# Patient Record
Sex: Male | Born: 1950 | Race: White | Hispanic: No | Marital: Married | Smoking: Former smoker
Health system: Southern US, Academic
[De-identification: ages and names within clinical notes are randomized; demographics above are authoritative.]

## PROBLEM LIST (undated history)

## (undated) DIAGNOSIS — M129 Arthropathy, unspecified: Secondary | ICD-10-CM

## (undated) DIAGNOSIS — Z85828 Personal history of other malignant neoplasm of skin: Secondary | ICD-10-CM

## (undated) DIAGNOSIS — J342 Deviated nasal septum: Secondary | ICD-10-CM

## (undated) DIAGNOSIS — I10 Essential (primary) hypertension: Secondary | ICD-10-CM

## (undated) DIAGNOSIS — H6123 Impacted cerumen, bilateral: Secondary | ICD-10-CM

## (undated) DIAGNOSIS — E119 Type 2 diabetes mellitus without complications: Secondary | ICD-10-CM

## (undated) DIAGNOSIS — J309 Allergic rhinitis, unspecified: Secondary | ICD-10-CM

## (undated) DIAGNOSIS — K219 Gastro-esophageal reflux disease without esophagitis: Secondary | ICD-10-CM

## (undated) HISTORY — DX: Essential (primary) hypertension: I10

## (undated) HISTORY — DX: Gastro-esophageal reflux disease without esophagitis: K21.9

## (undated) HISTORY — DX: Deviated nasal septum: J34.2

## (undated) HISTORY — DX: Allergic rhinitis, unspecified: J30.9

## (undated) HISTORY — DX: Personal history of other malignant neoplasm of skin: Z85.828

## (undated) HISTORY — DX: Impacted cerumen, bilateral: H61.23

## (undated) HISTORY — DX: Type 2 diabetes mellitus without complications: E11.9

---
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
## 1996-03-02 ENCOUNTER — Other Ambulatory Visit (HOSPITAL_COMMUNITY): Payer: Self-pay

## 2002-05-12 HISTORY — PX: SURGERY SCROTAL / TESTICULAR: SUR1316

## 2007-06-09 DIAGNOSIS — R9439 Abnormal result of other cardiovascular function study: Secondary | ICD-10-CM | POA: Insufficient documentation

## 2020-03-24 IMAGING — MR MRI LUMBAR SPINE WITHOUT CONTRAST
7 series · 48 of 48 positions shown · non-contrast
Comparison: None available.

﻿EXAM:  MRI LUMBAR SPINE WITHOUT CONTRAST
INDICATION: 69-year-old male with history of low back pain with radiculopathy.  No history of malignancy or back surgery.
TECHNIQUE: Axial, coronal and sagittal images including T1, T2 and STIR sequences.  Overall quality of the images is less than optimal due to body habitus.  Quality of the images is acceptable for interpretation.

[Series 11: T2 · sagittal · 5.0mm · 1.00mm/px · 5 of 15 slices shown (1 of 4)]
[im 1/15]
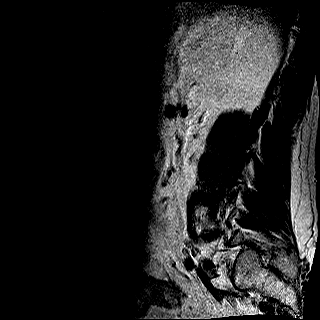
[im 4/15]
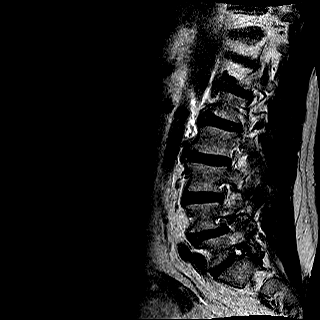
[im 8/15]
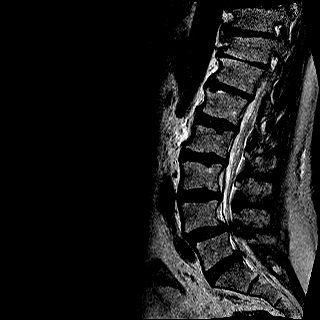
[im 11/15]
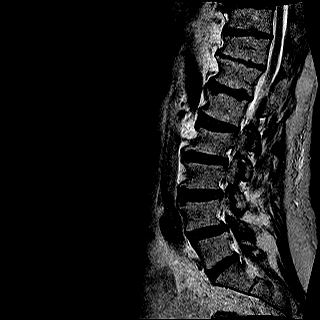
[im 15/15]
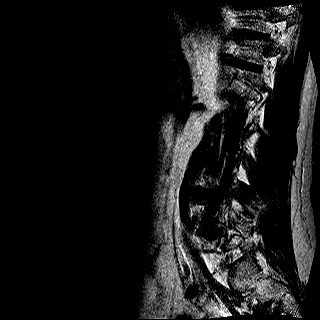

[Series 13: T1 · sagittal · 5.0mm · 1.00mm/px · 6 of 17 slices shown (1 of 2)]
[im 1/17]
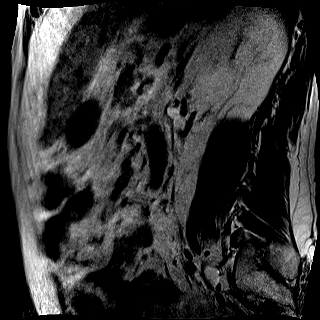
[im 4/17]
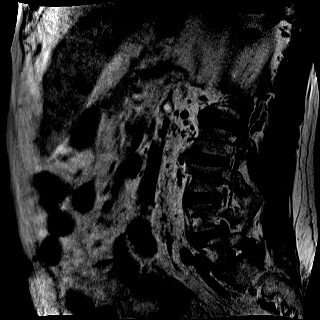
[im 7/17]
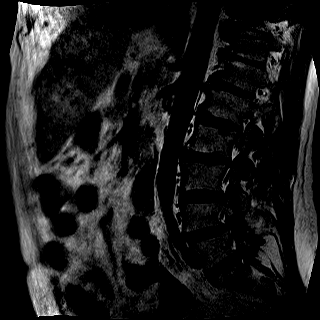
[im 10/17]
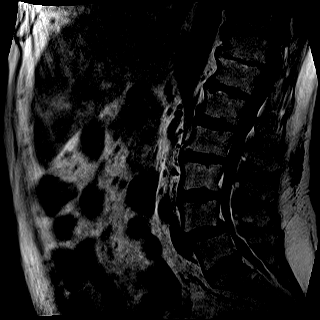
[im 13/17]
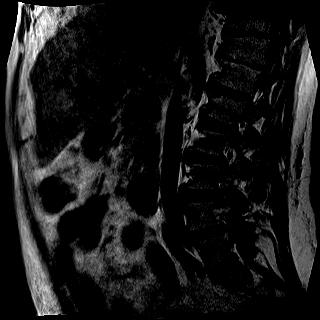
[im 17/17]
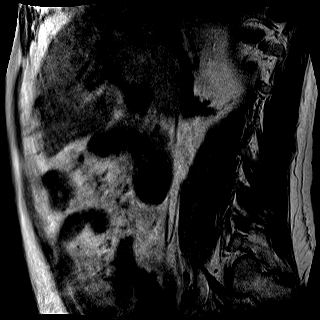

[Series 14: STIR · sagittal · 5.0mm · 1.25mm/px · 6 of 17 slices shown]
[im 1/17]
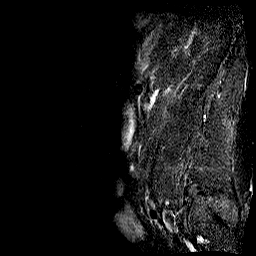
[im 4/17]
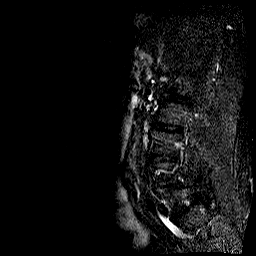
[im 7/17]
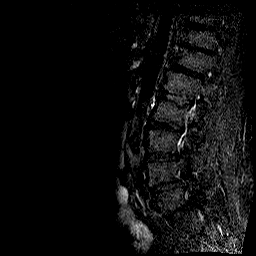
[im 10/17]
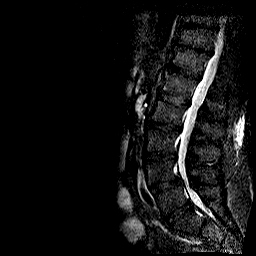
[im 13/17]
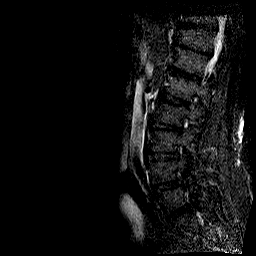
[im 17/17]
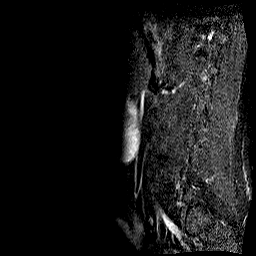

[Series 15: T2 · axial · 5.0mm · 0.89mm/px · z∈[-148,+71]mm · 9 of 25 slices shown (2 of 4)]
[im 1/25]
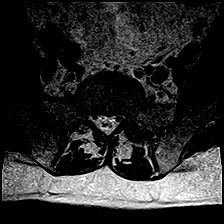
[im 4/25]
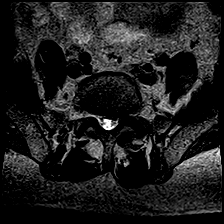
[im 7/25]
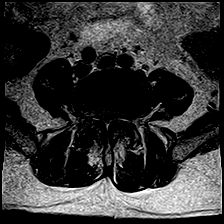
[im 10/25]
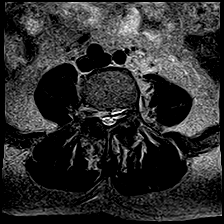
[im 13/25]
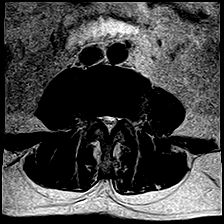
[im 16/25]
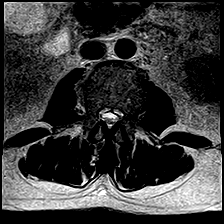
[im 19/25]
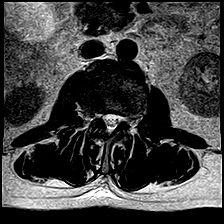
[im 22/25]
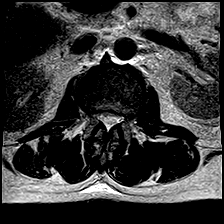
[im 25/25]
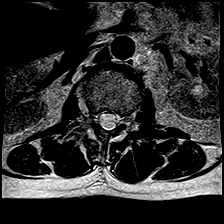

[Series 16: T1 · axial · 5.0mm · 0.89mm/px · z∈[-148,+71]mm · 9 of 25 slices shown (2 of 2)]
[im 1/25]
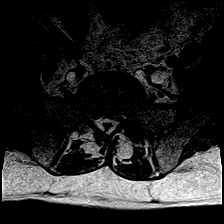
[im 4/25]
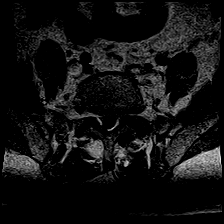
[im 7/25]
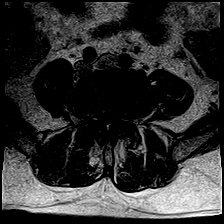
[im 10/25]
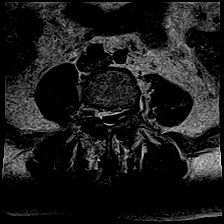
[im 13/25]
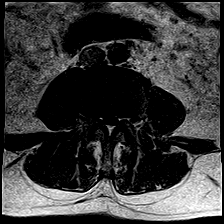
[im 16/25]
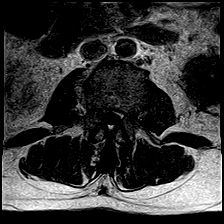
[im 19/25]
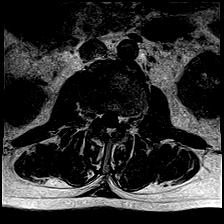
[im 22/25]
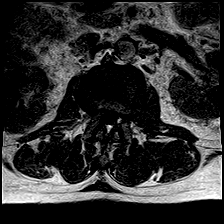
[im 25/25]
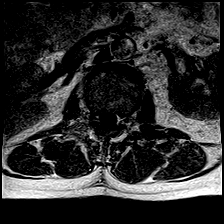

[Series 17: T2 · coronal · 4.0mm · 1.34mm/px · 7 of 20 slices shown (3 of 4)]
[im 1/20]
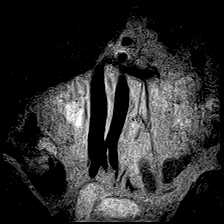
[im 4/20]
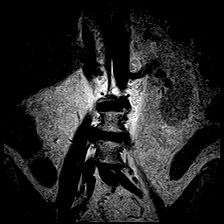
[im 7/20]
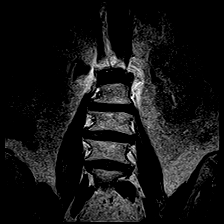
[im 10/20]
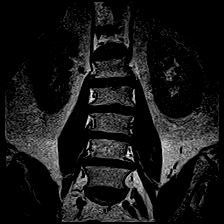
[im 13/20]
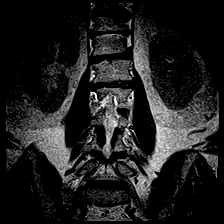
[im 16/20]
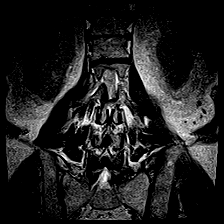
[im 20/20]
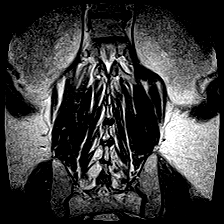

[Series 18: T2 · sagittal · 5.0mm · 1.00mm/px · 6 of 17 slices shown (4 of 4)]
[im 1/17]
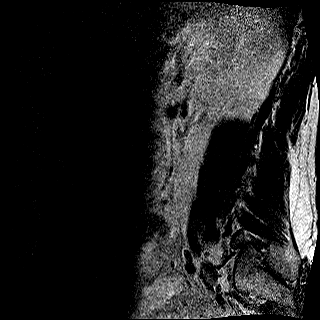
[im 4/17]
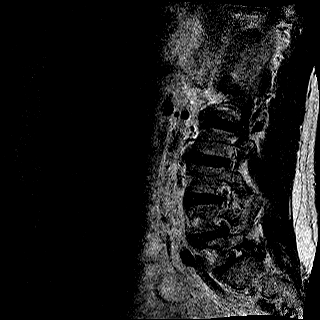
[im 7/17]
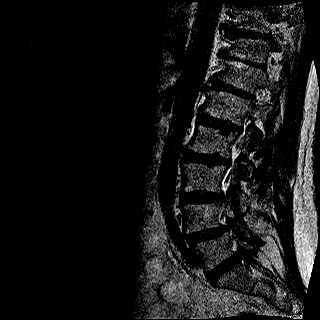
[im 10/17]
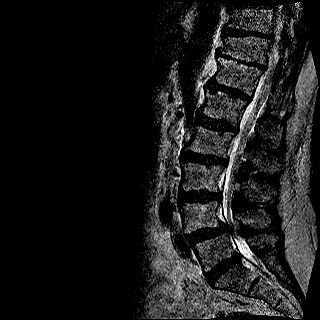
[im 13/17]
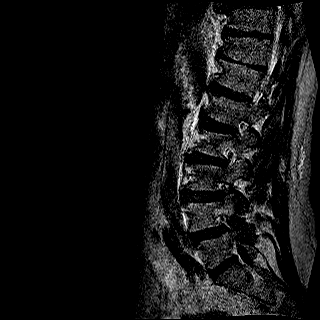
[im 17/17]
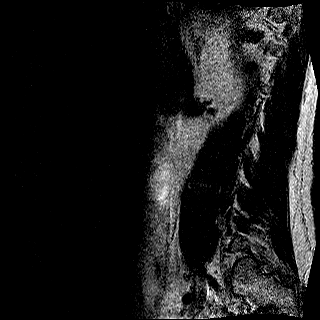

[48 of 48 positions shown; findings below may reference images not displayed]

FINDINGS: No acute bony abnormalities of lumbar vertebrae are seen.  Lower spinal cord and cauda equina are unremarkable to the extent visualized.  

At L1-L2 level, bulging annulus and facet arthropathy are causing moderate compromise of lateral recess and neural foramina, right more than the left.  

At L2-L3 level, degenerative disc disease with bulging annulus and facet arthropathy are causing mild to moderate compromise of both neural foramina.  

At L3-L4 level, significant degenerative disc disease and facet arthropathy are noted causing moderate compromise of lateral recess and neural foramina.  AP diameter of thecal sac in the midline measures 7.8 mm.  

At L4-L5 level, advanced bilateral facet arthropathy with hypertrophy of dorsal ligaments and significant degenerative disc disease with bulging annulus are noted.  Severe degree of spinal stenosis and severe compromise of both lateral recesses and neural foramina are noted at L4-L5 level.  AP diameter of thecal sac in the midline measures 4.4 mm.  Minimal first-degree degenerative spondylolisthesis of L4 on L5 is noted.  

At L5-S1 level, significant degenerative disc disease with bulging annulus is noted.  Asymmetric bulging annulus and disc protrusion on the left side at the level of lateral recess and neural foramen are noted causing significant compromise of the left neural foramen and lateral recess at L5-S1 level impinging predominantly on the left L5 and S1 nerve roots.  

Paravertebral soft tissues are unremarkable.
IMPRESSION: 1. Overall visualization less than optimal due to large body habitus of this patient.  

2. At L4-L5 level, advanced bilateral facet arthropathy with hypertrophy of dorsal ligaments and significant degenerative disc disease with bulging annulus are noted.  Severe degree of spinal stenosis and severe compromise of both lateral recesses and neural foramina are noted at L4-L5 level.  AP diameter of thecal sac in the midline measures 4.4 mm.  Minimal first-degree degenerative spondylolisthesis of L4 on L5 is noted.  

3. At L5-S1 level, significant degenerative disc disease with bulging annulus is noted.  Asymmetric bulging annulus and disc protrusion on the left side at the level of lateral recess and neural foramen are noted causing significant compromise of the left neural foramen and lateral recess at L5-S1 level impinging predominantly on the left L5 and S1 nerve roots.  

4. Findings at other disc levels are described above in detail.

## 2020-04-02 IMAGING — CT CT ABDOMEN&PELVIS WITHOUT AND WITH CONTRAST
2 of 5 series · 16 of 46 positions shown, 18 images · IV contrast (agent unspecified)
Comparison: Sonogram dated 02/23/2020.

﻿EXAM:  CT ABDOMEN&PELVIS WITHOUT AND WITH CONTRAST
INDICATION: Right-sided abdominal pain.
TECHNIQUE: Axial CT imaging of the abdomen and pelvis was performed without and with 100 mL of Optiray 300. Oral contrast was also administered. Images were reviewed in multiple windows and projections. Exam was performed using 1 or more of the following dose reduction techniques: Automated exposure control, adjustment of the mA and/or kV according to patient size, or the use of iterative reconstruction technique.

[post · axial · 0.85mm/px · z∈[-1466,-1010]mm · 13 of 134 slices shown, 15 images]
[im 10/134  soft-tissue]
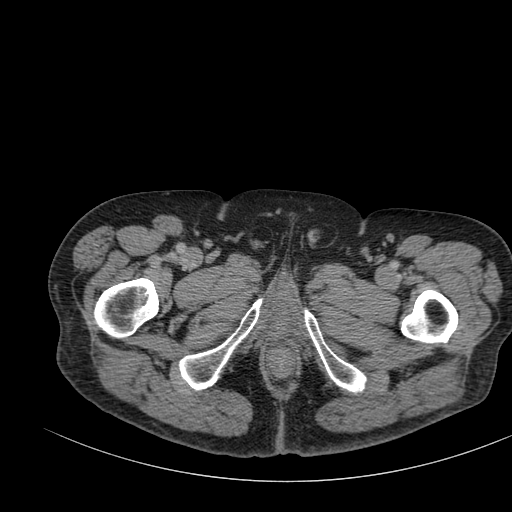
[im 10/134  bone]
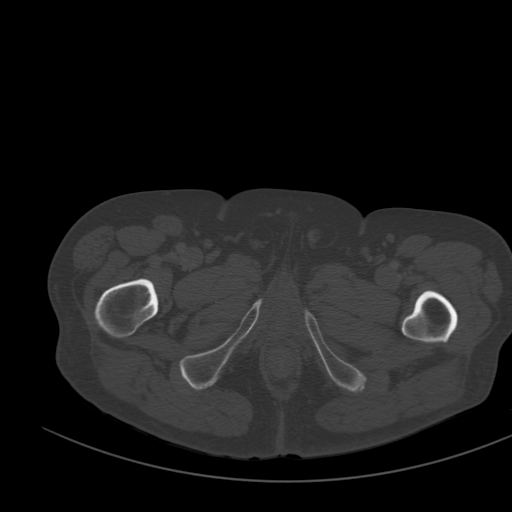
[im 20/134  soft-tissue]
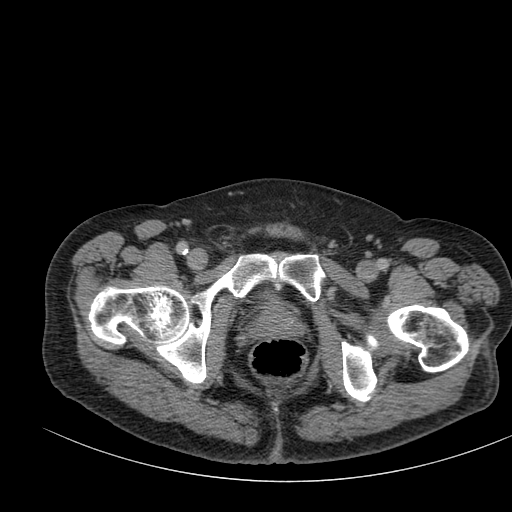
[im 29/134  soft-tissue]
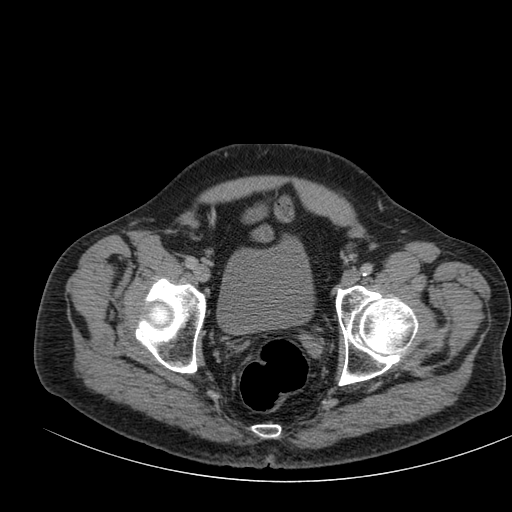
[im 39/134  soft-tissue]
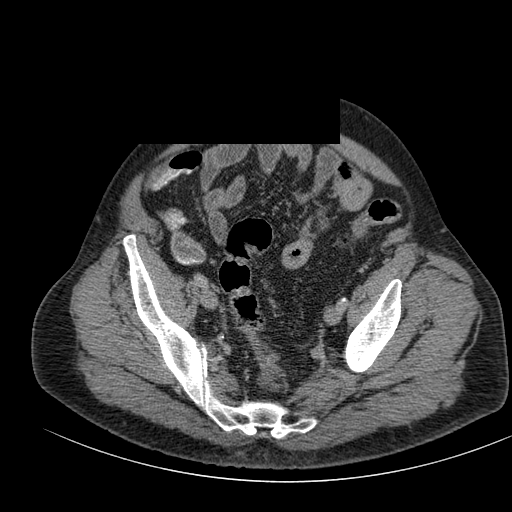
[im 48/134  soft-tissue]
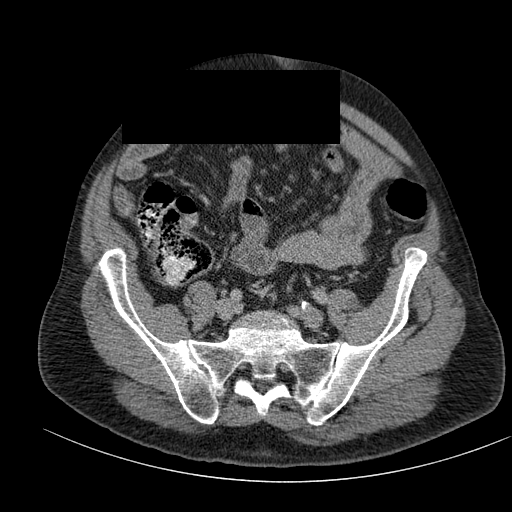
[im 58/134  soft-tissue]
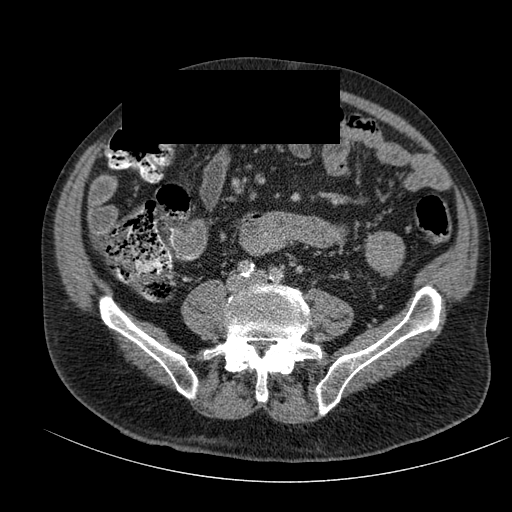
[im 67/134  soft-tissue]
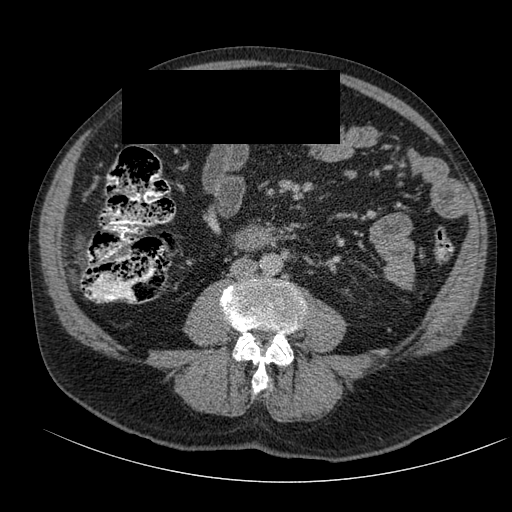
[im 77/134  soft-tissue]
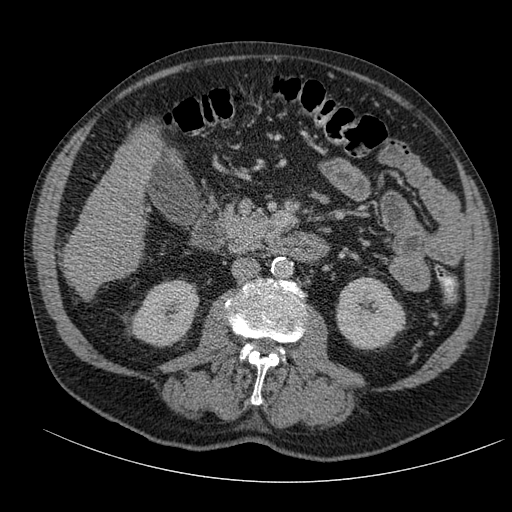
[im 86/134  soft-tissue]
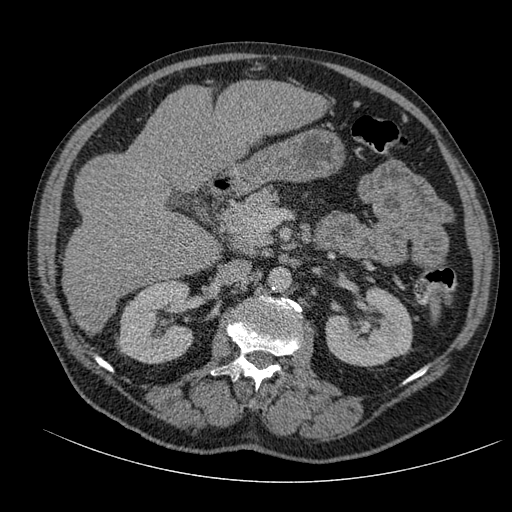
[im 86/134  bone]
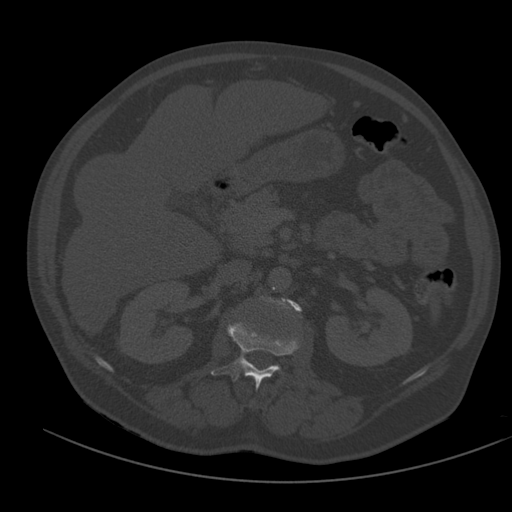
[im 96/134  soft-tissue]
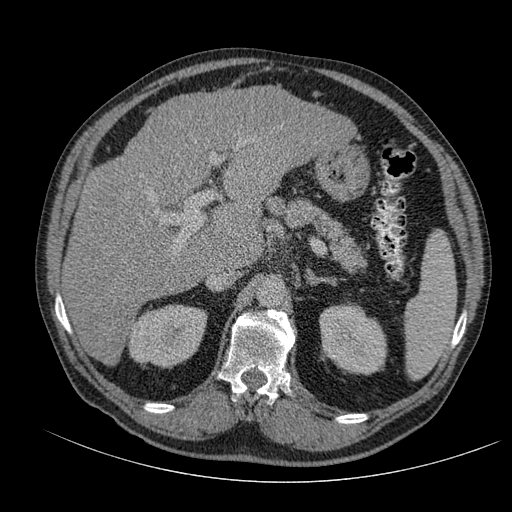
[im 105/134  soft-tissue]
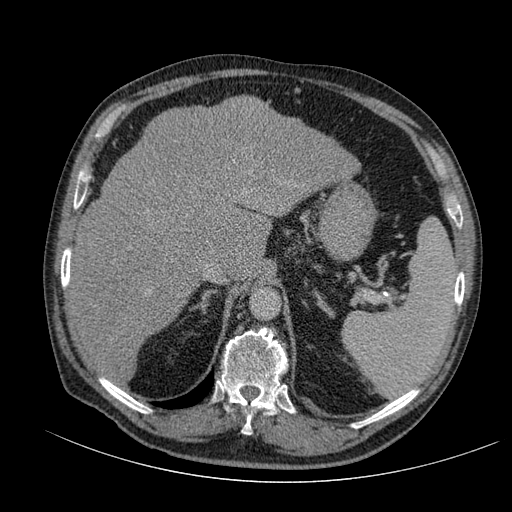
[im 115/134  soft-tissue]
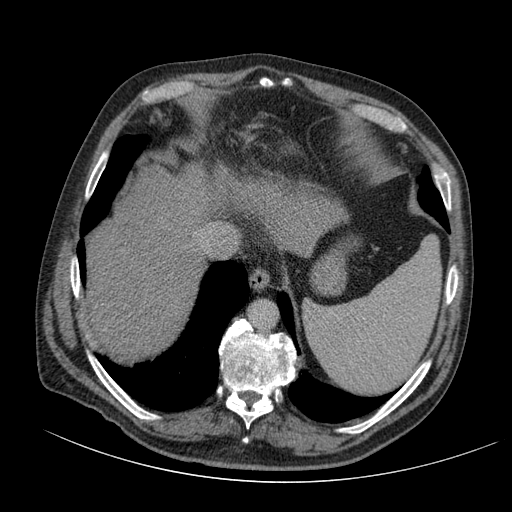
[im 124/134  soft-tissue]
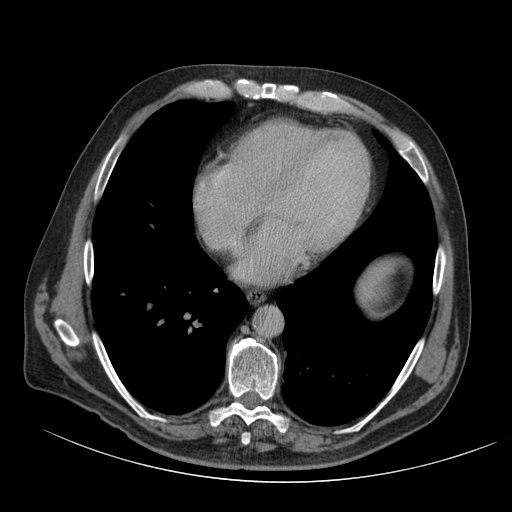

[cor · coronal · 1.04mm/px · 3 of 127 slices shown]
[im 43/127  soft-tissue]
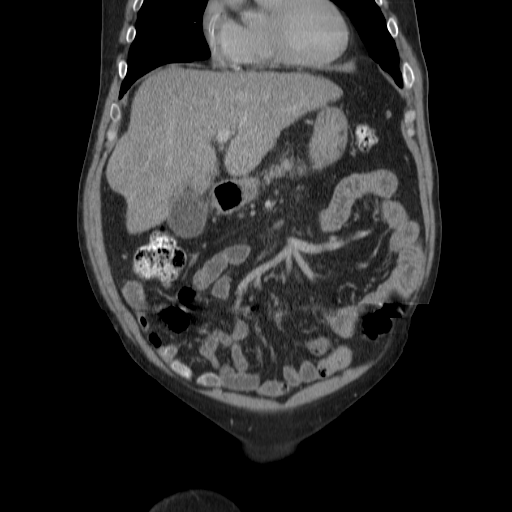
[im 57/127  soft-tissue]
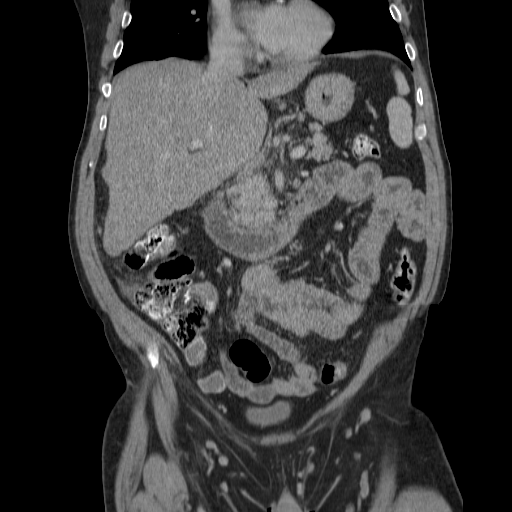
[im 71/127  soft-tissue]
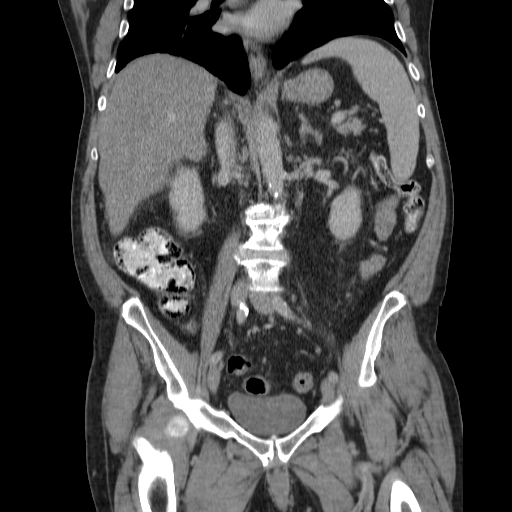

[16 of 46 positions shown; findings below may reference images not displayed]

FINDINGS: Limited visualization of lung bases is unremarkable. There is no pleural or pericardial effusion. 

Nodular hepatic contour is compatible with cirrhosis. There is suggestion of a 6.5 cm inferior right hepatic lobe mass. Spleen is enlarged measuring 16.5 cm. There is a 10.5 mm nonobstructing right kidney upper pole calculus. A punctate nonobstructing left kidney lower pole calculus is also seen. Pancreas and adrenal glands are normal.

Bowel loops are normal in course and caliber, there is no obstruction or free air. Appendix is normal. There is small pelvic ascites. There is no adenopathy. There are significant vascular calcifications. There are advanced multilevel arthritic changes within the visualized thoracolumbar spine.
IMPRESSION: 1. No acute abnormality within the upper abdomen and pelvis. 

2. Cirrhotic liver with splenomegaly and small pelvic ascites. 

3. Suggestion of a 6.5 cm inferior right hepatic lobe mass. Follow-up contrast-enhanced MRI as per liver mass protocol is recommended for better assessment.

## 2020-05-02 IMAGING — MR MRI ABDOMEN WITHOUT AND WITH CONTRAST
11 of 15 series · 30 of 48 positions shown · IV contrast (Eovist)
Comparison: CT abdomen and pelvis dated 04/02/2020.

﻿EXAM:  MRI ABDOMEN WITHOUT AND WITH CONTRAST/LIVER
INDICATION: Cirrhotic liver, evaluate for mass.
TECHNIQUE: Multiplanar, multisequential MRI of the liver was performed without and with 7 mL of Eovist.

[Series 7: cor basg bh · coronal · 11.0mm · 0.78mm/px · 3 of 24 slices shown]
[im 1/24]
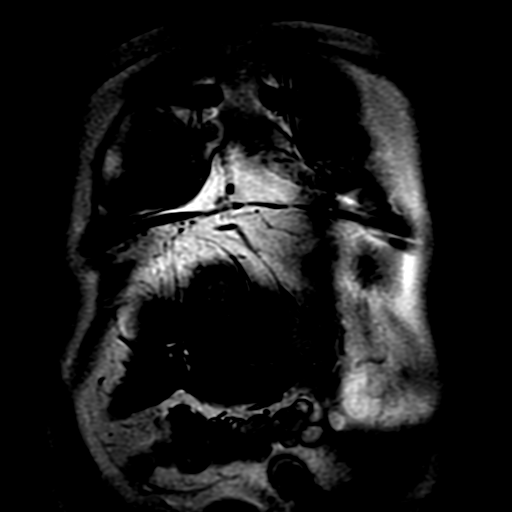
[im 12/24]
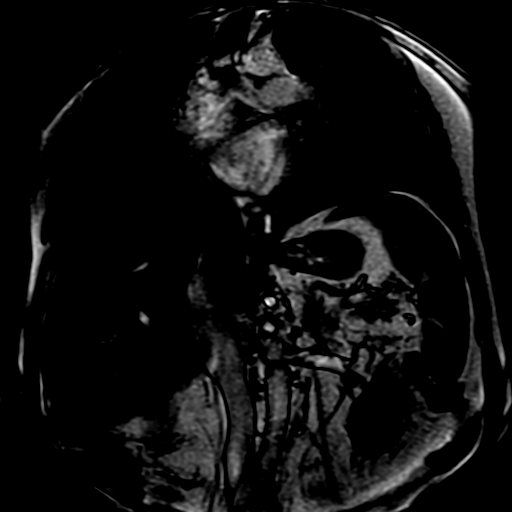
[im 24/24]
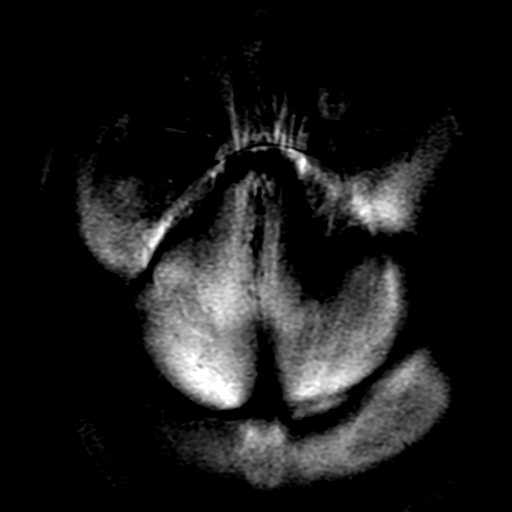

[Series 8: axial in/out phase · axial · 9.0mm · 1.56mm/px · z∈[-159,+71]mm · 3 of 24 slices shown (1 of 2)]
[im 1/24]
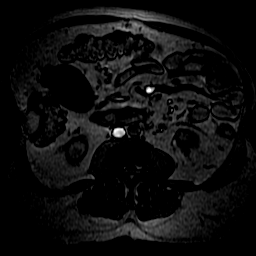
[im 12/24]
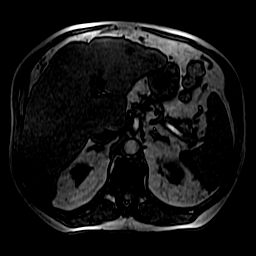
[im 24/24]
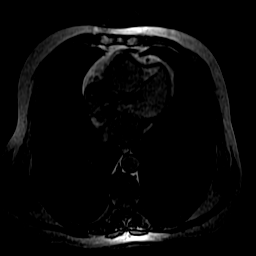

[Series 9: axial in/out phase · axial · 9.0mm · 1.56mm/px · z∈[-159,+71]mm · 3 of 24 slices shown (2 of 2)]
[im 1/24]
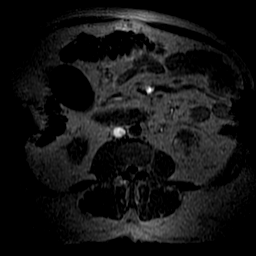
[im 12/24]
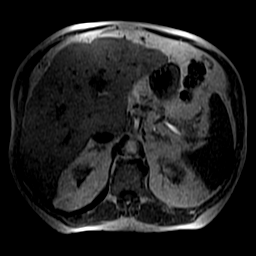
[im 24/24]
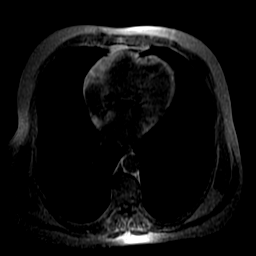

[Series 10: T2 · axial · 9.0mm · 1.56mm/px · z∈[-159,+71]mm · 3 of 24 slices shown]
[im 1/24]
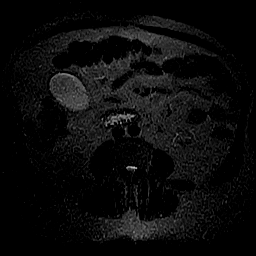
[im 12/24]
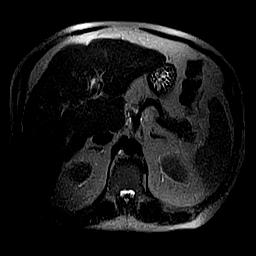
[im 24/24]
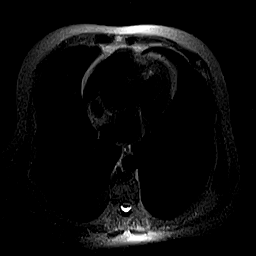

[Series 11: T2 fat-sat · axial · 9.0mm · 1.56mm/px · z∈[-159,+71]mm · 3 of 24 slices shown]
[im 1/24]
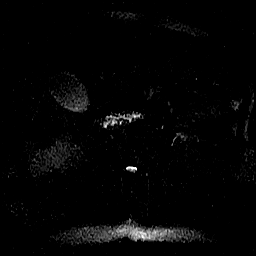
[im 12/24]
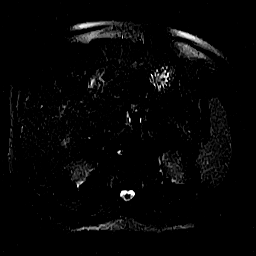
[im 24/24]
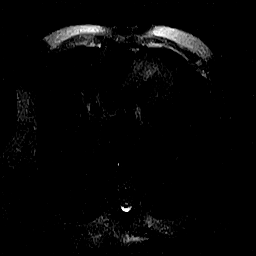

[Series 15: axial basg bh · axial · 9.0mm · 0.78mm/px · z∈[-159,+71]mm · 3 of 24 slices shown]
[im 1/24]
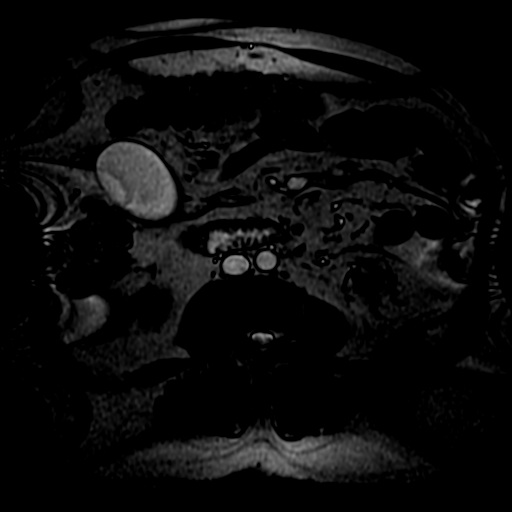
[im 12/24]
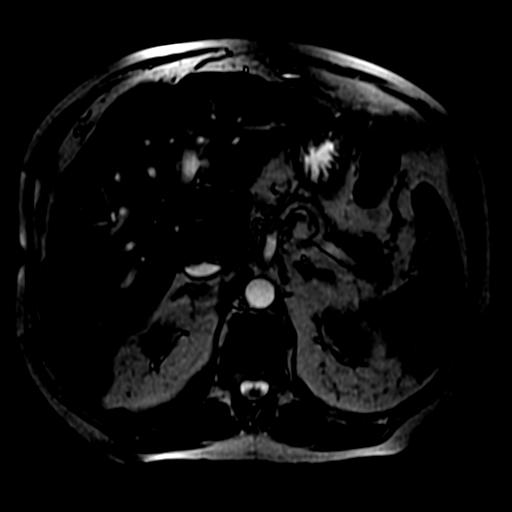
[im 24/24]
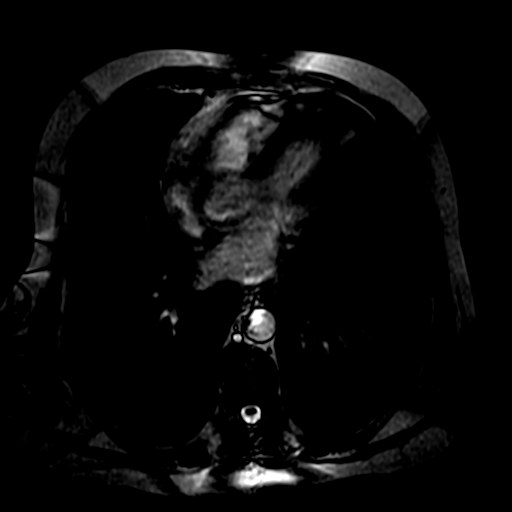

[Series 16: T1 · coronal · 11.0mm · 1.56mm/px · 2 of 24 slices shown (1 of 2)]
[im 1/24]
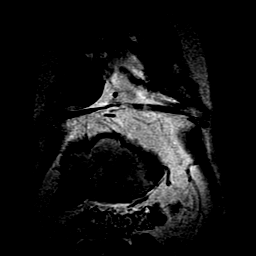
[im 24/24]
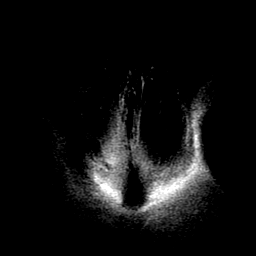

[Series 20: cor basg bh-2 · coronal · 13.0mm · 0.78mm/px · 2 of 24 slices shown]
[im 1/24]
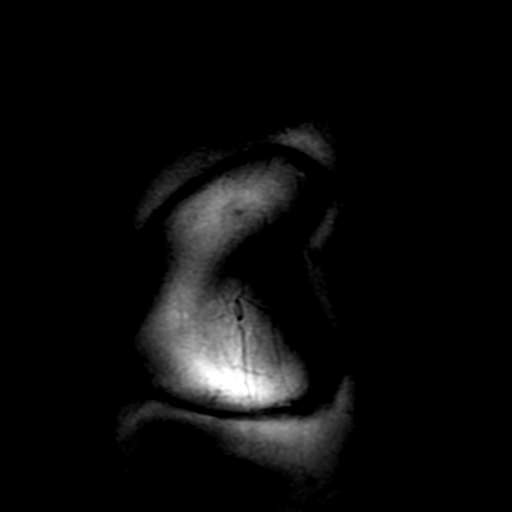
[im 24/24]
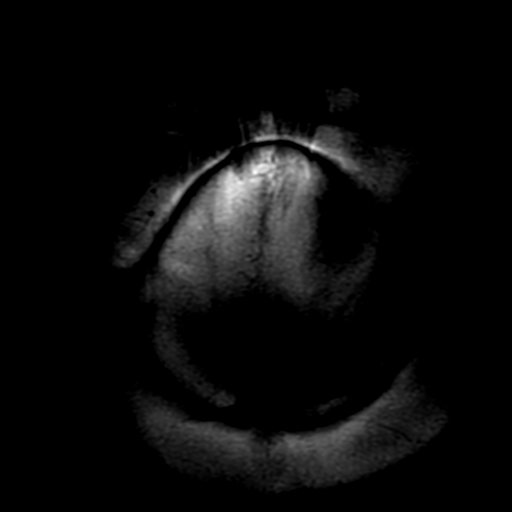

[Series 21: T1 · coronal · 13.0mm · 1.56mm/px · 2 of 24 slices shown (2 of 2)]
[im 1/24]
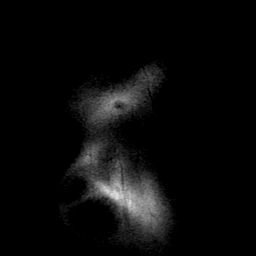
[im 24/24]
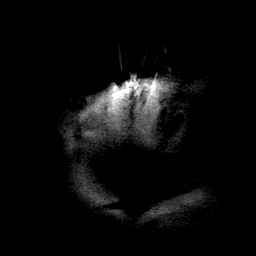

[Series 22: (person_name) pre · axial · non-contrast · 9.5mm · 0.78mm/px · z∈[-169,+44]mm · 4 of 46 slices shown]
[im 1/46]
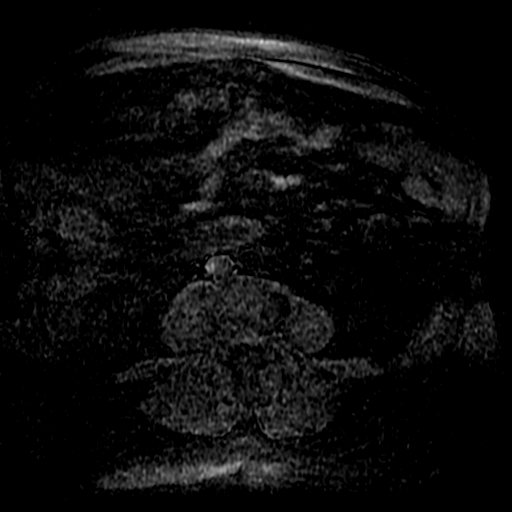
[im 16/46]
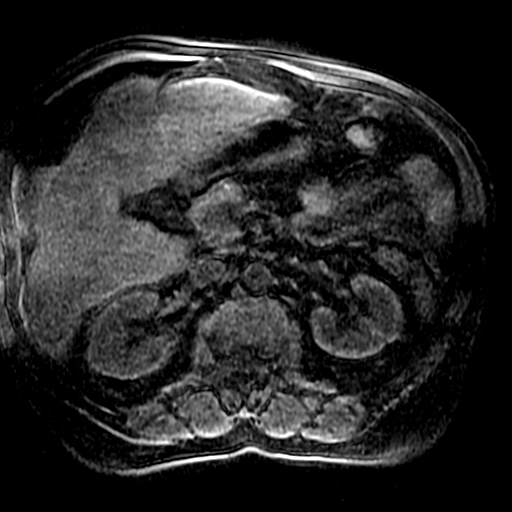
[im 31/46]
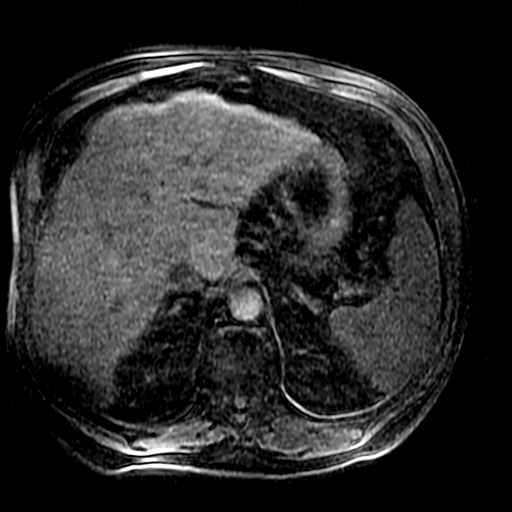
[im 46/46]
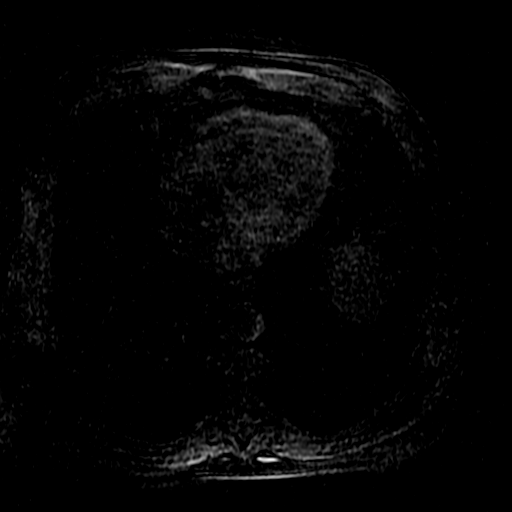

[Series 23: axial (person_name) - · axial · arterial · 9.5mm · 0.78mm/px · z∈[-169,-98]mm · 2 of 46 slices shown]
[im 1/46]
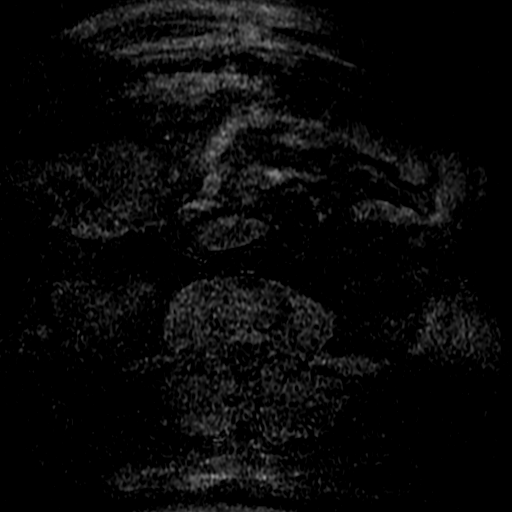
[im 16/46]
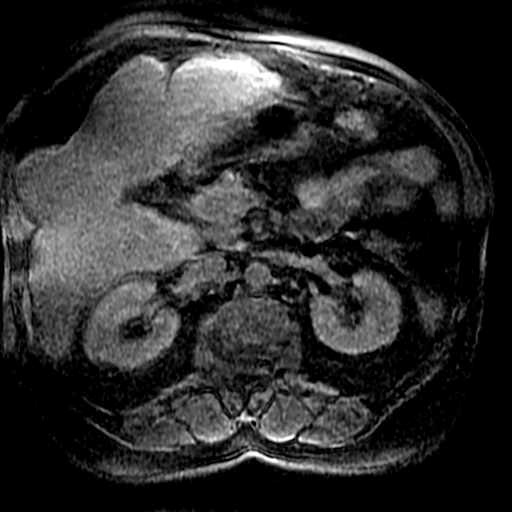

[30 of 48 positions shown; findings below may reference images not displayed]

FINDINGS: Liver is heterogeneous is signal intensity with a lobular contour compatible with cirrhosis.  Previously noted 6.5 cm focal bulge within the inferior right hepatic lobe does not appear to be associated with an underlying mass.  No suspicious liver lesion is identified at this time.  Spleen is again noted to be enlarged, measuring 16 cm.  Gallbladder, pancreas, adrenal glands and kidneys are normal.  No abnormal enhancement or retroperitoneal adenopathy is seen.  There is no pleural effusion or upper abdominal ascites.
IMPRESSION: Stable cirrhotic liver without a discrete mass.  Continued followup is recommended.

## 2020-07-17 IMAGING — US ABD LIMITED
1 series · 14 of 25 positions shown · non-contrast
Comparison: 05/31/2018.

EXAM:  SOTO PROFESSIONAL READ ABD U/S LMTD
INDICATION: Fatty liver.

[Series 1: abd limited · 14 of 60 slices shown]
[im 1/60]
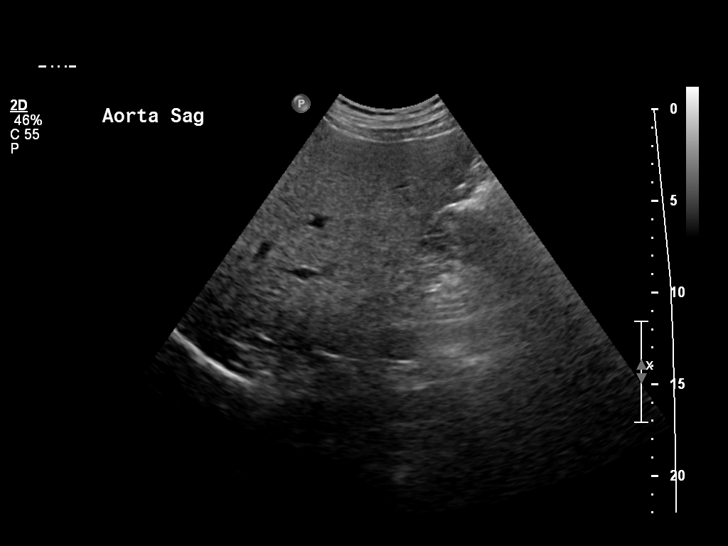
[im 5/60]
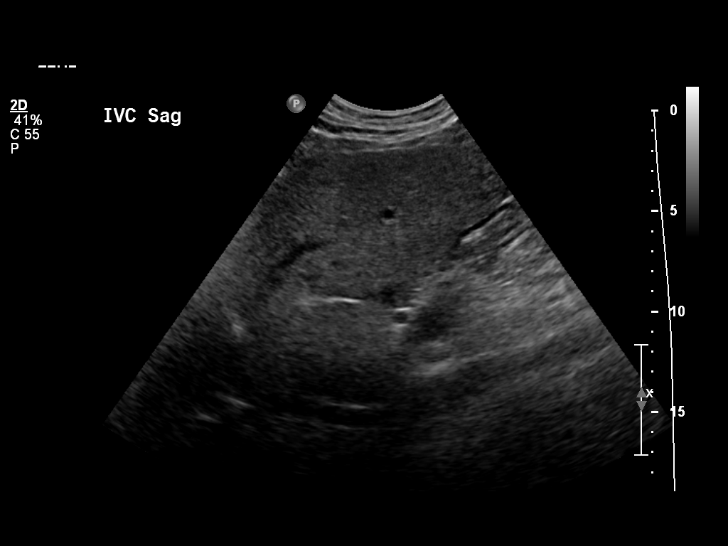
[im 10/60]
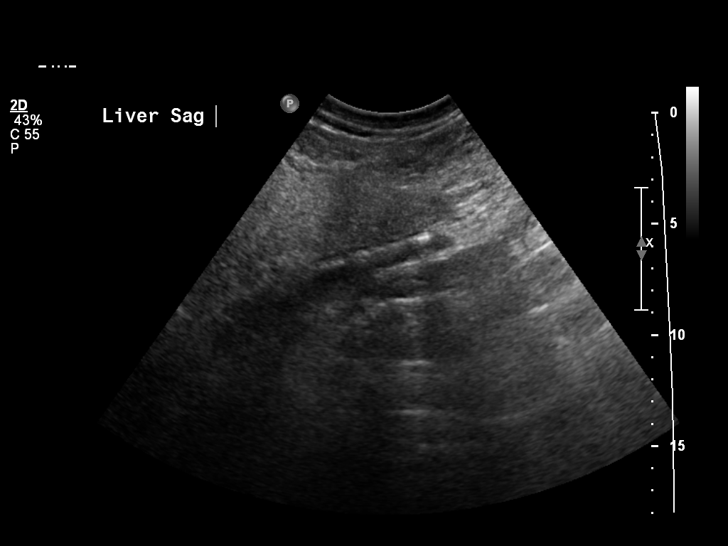
[im 15/60]
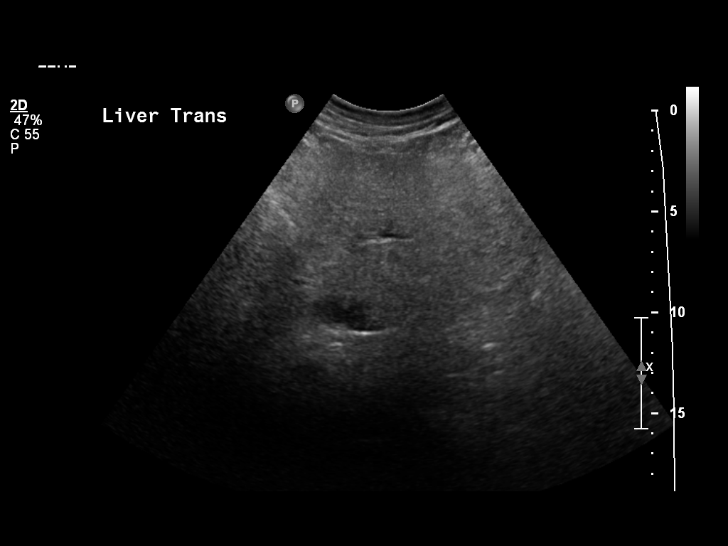
[im 20/60]
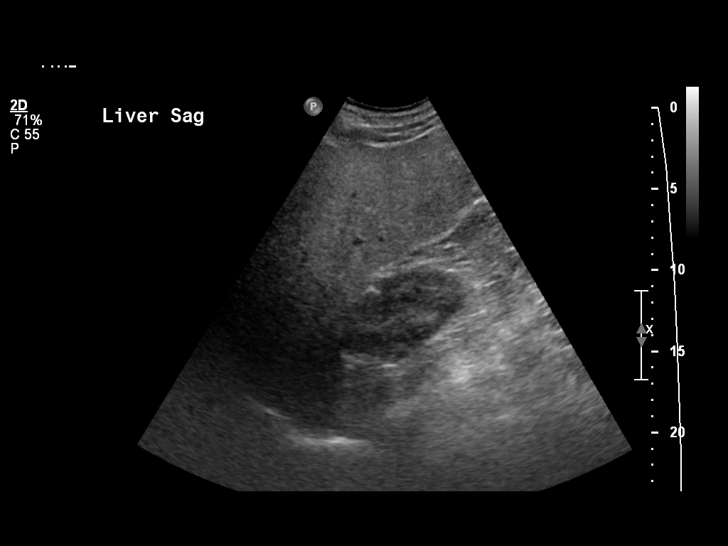
[im 23/60]
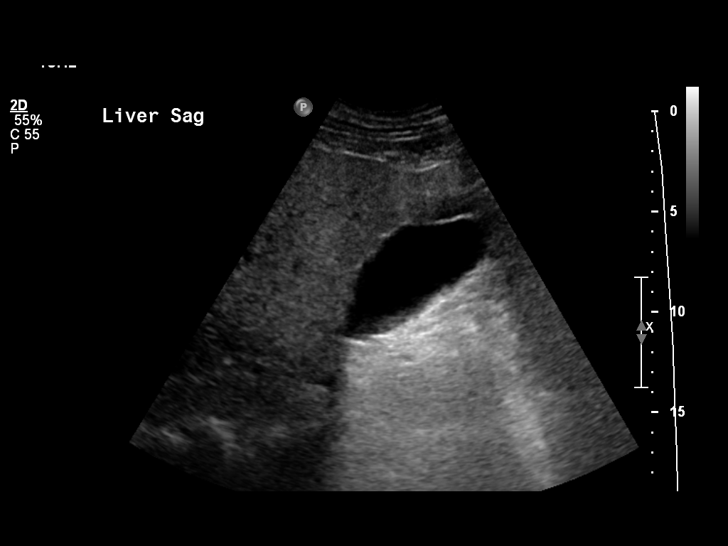
[im 28/60]
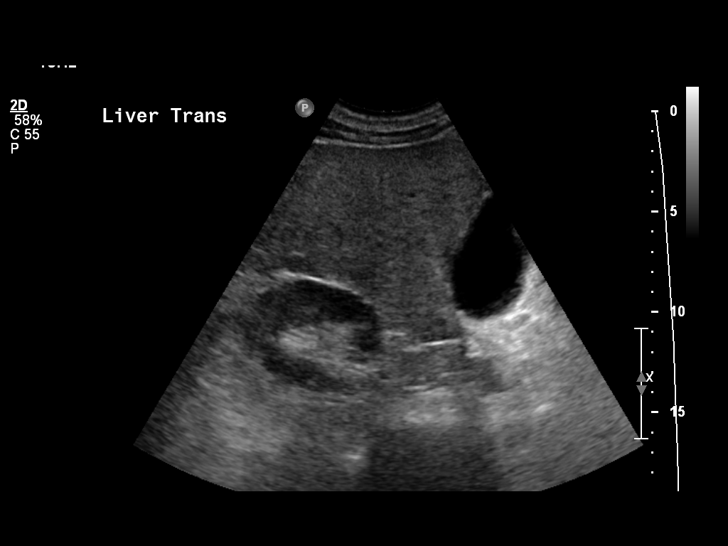
[im 32/60]
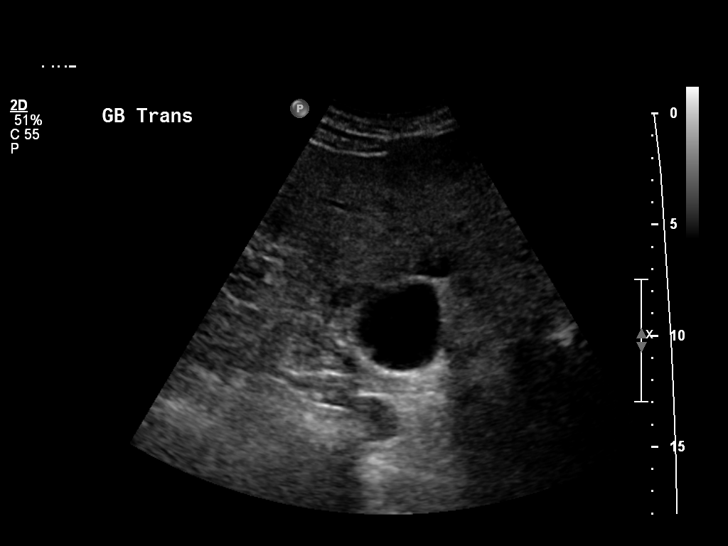
[im 37/60]
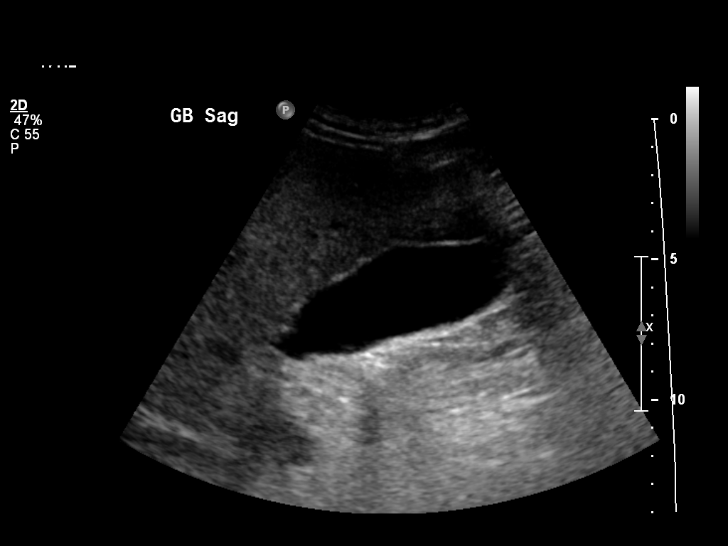
[im 40/60]
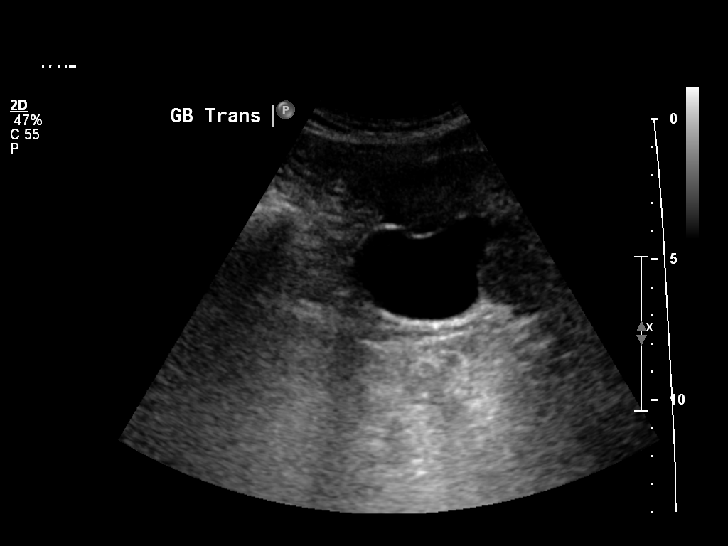
[im 45/60]
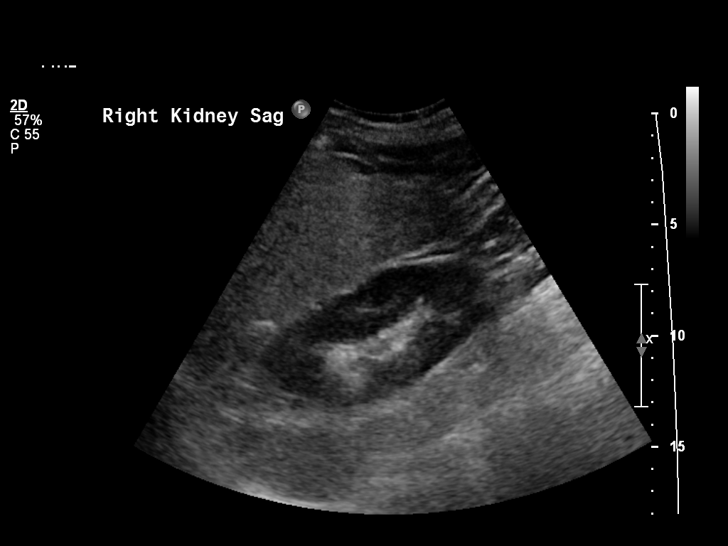
[im 50/60]
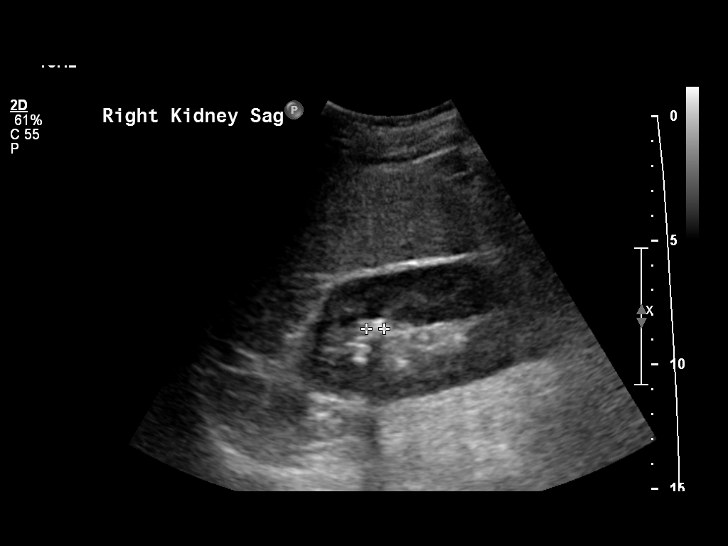
[im 55/60]
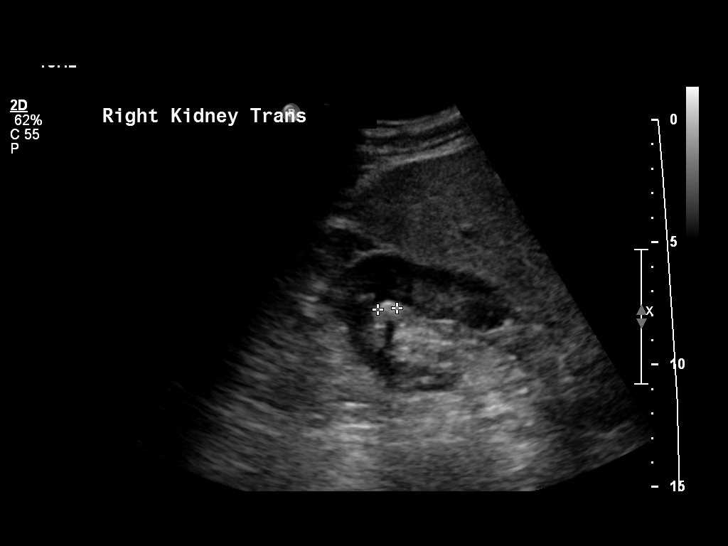
[im 60/60]
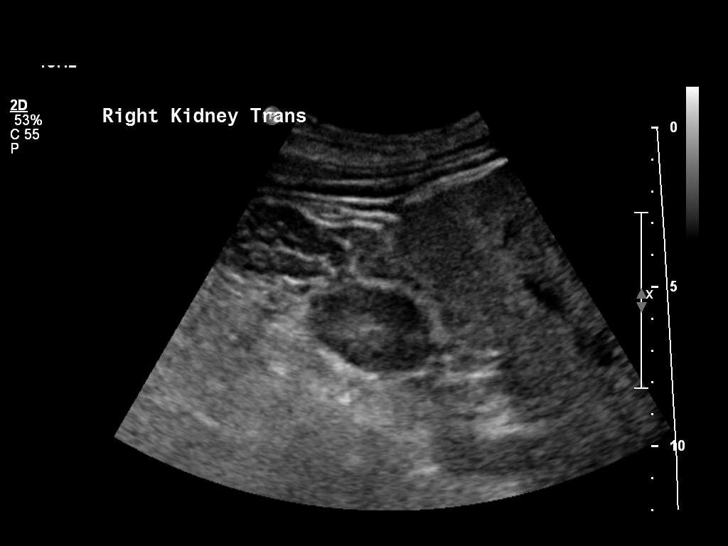

[14 of 25 positions shown; findings below may reference images not displayed]

FINDINGS: Liver is fatty and enlarged measuring 22 cm in maximum sagittal dimension. Fatty infiltration limits evaluation for focal hepatic mass. There is no intra or extrahepatic biliary ductal dilatation. Common bile duct measures 2.5 mm. No sludge or shadowing gallstones are seen. There is no gallbladder wall thickening or pericholecystic fluid. Pancreas is incompletely visualized due to artifact from overlying bowel gas. Right kidney measures 12.5 cm and there is a 7.5 mm nonobstructing upper pole calculus.

Visualized abdominal aorta is without aneurysmal dilatation. IVC is normal. Portal vein measures 13 mm in diameter and demonstrates hepatopetal flow. Hepatic veins are also patent. There is no ascites.
IMPRESSION: 1. Fatty and enlarged liver. 

2. No evidence of cholelithiasis or acute cholecystitis. 

3. A 7.5 mm nonobstructing right kidney upper pole calculus.

## 2021-01-12 IMAGING — US ABD LIMITED
1 series · 14 of 25 positions shown · non-contrast
Comparison: 11/26/2018.

EXAM:  TIGER PROFESSIONAL READ ABD U/S LMTD
INDICATION: Fatty liver.

[Series 1: abd limited · 14 of 59 slices shown]
[im 1/59]
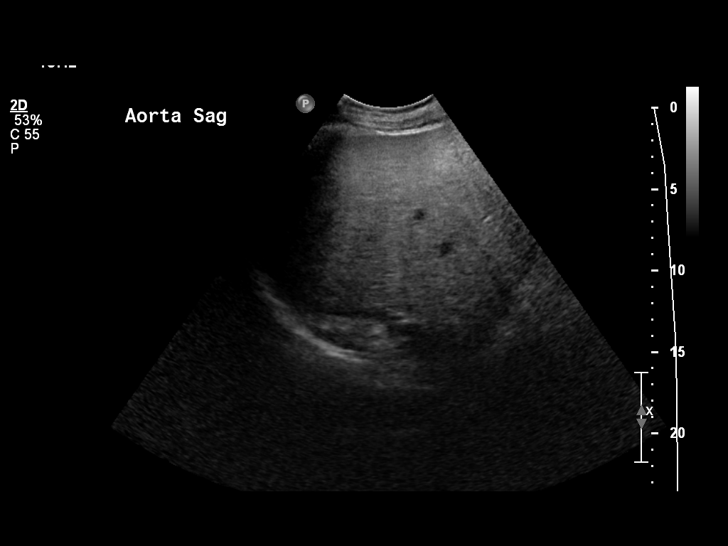
[im 5/59]
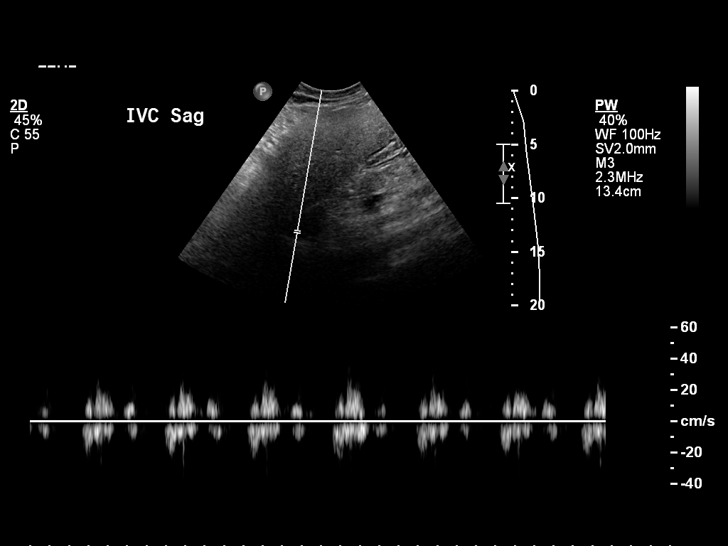
[im 10/59]
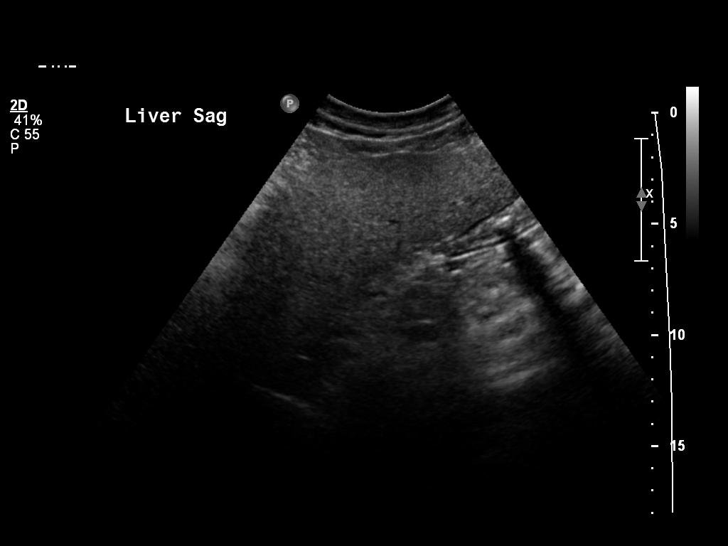
[im 15/59]
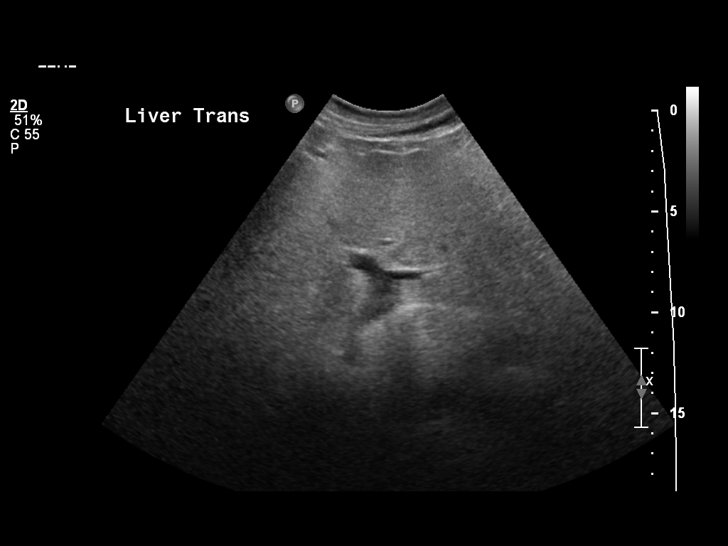
[im 20/59]
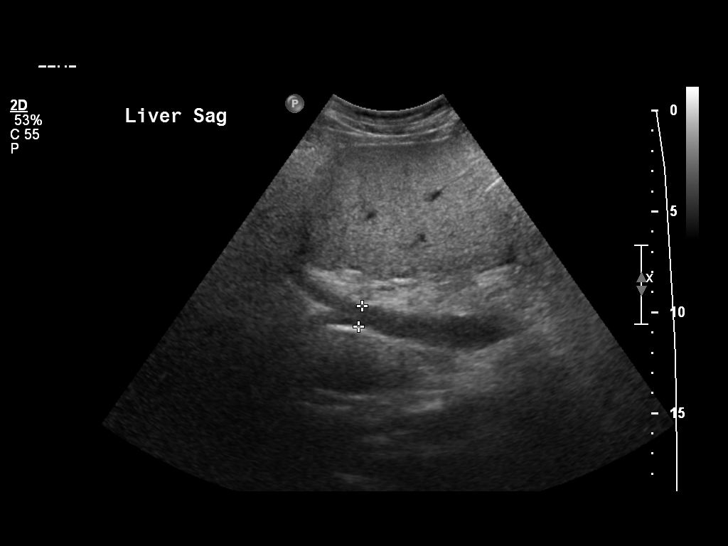
[im 22/59]
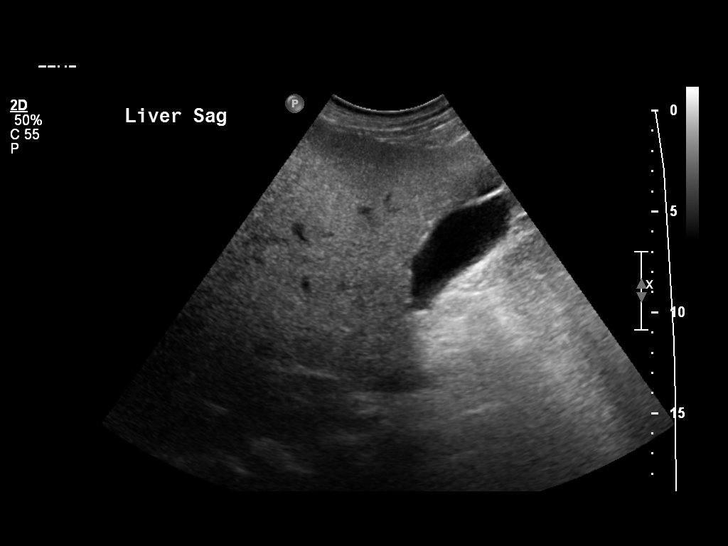
[im 27/59]
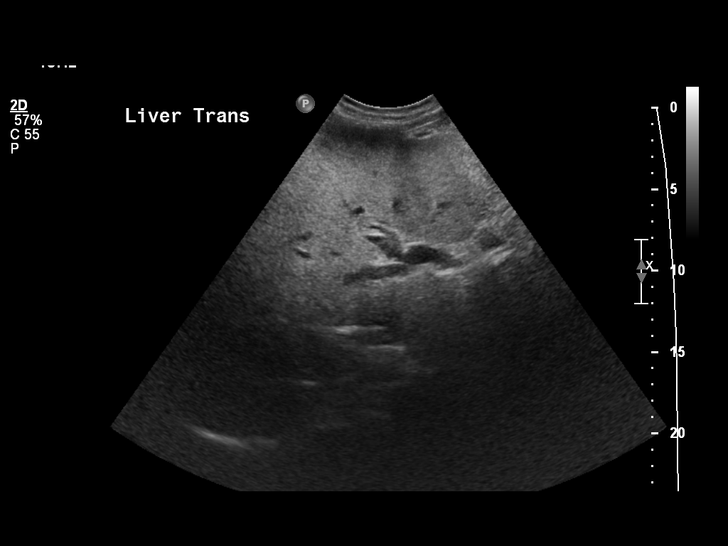
[im 32/59]
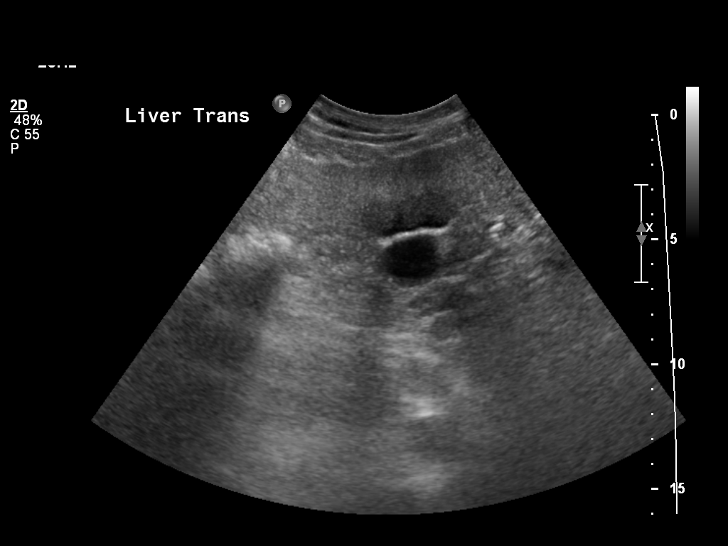
[im 37/59]
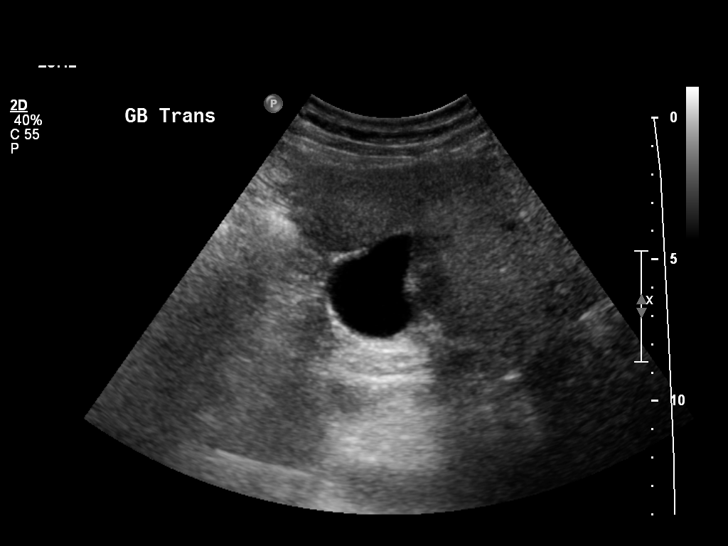
[im 39/59]
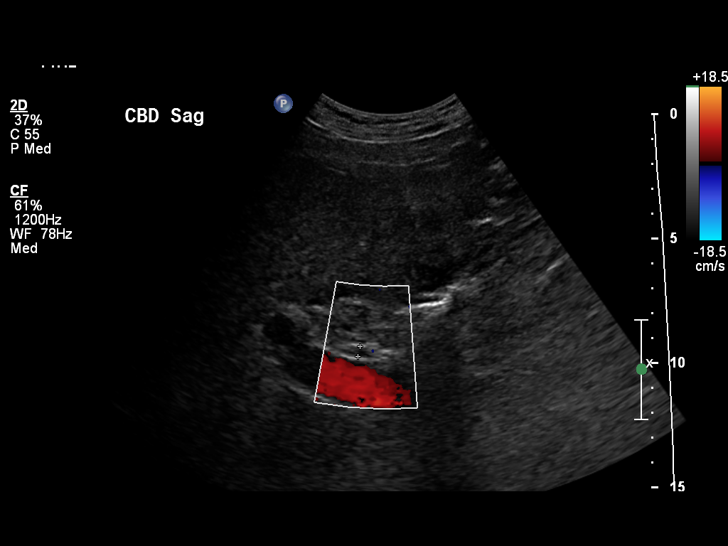
[im 44/59]
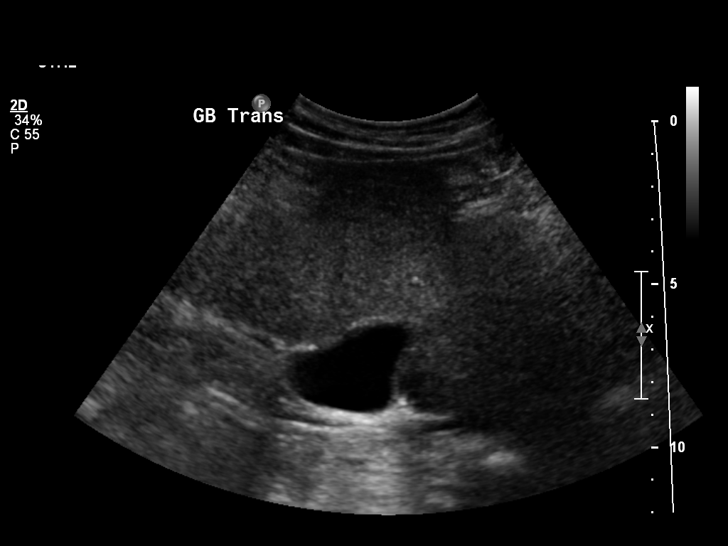
[im 49/59]
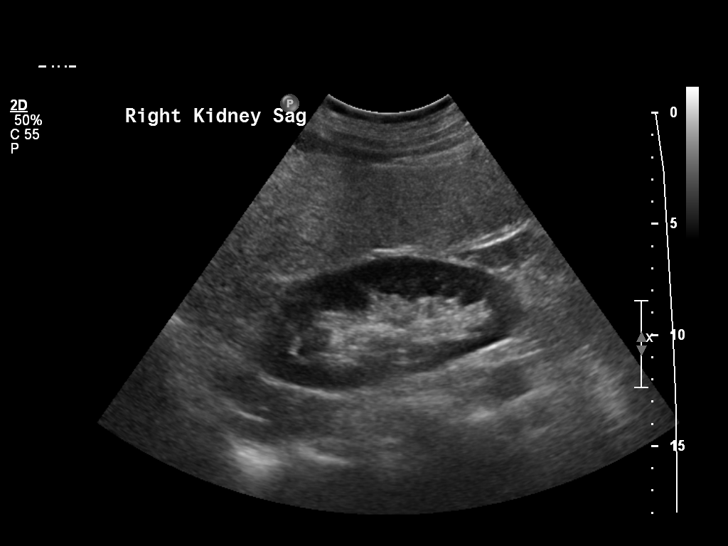
[im 54/59]
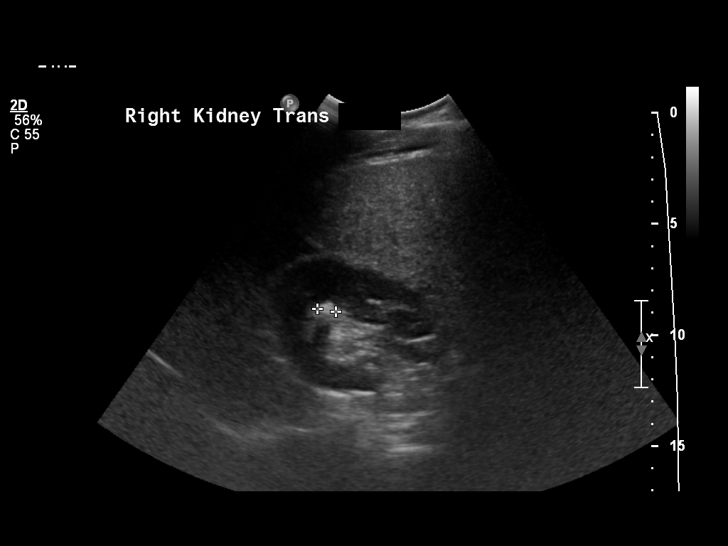
[im 59/59]
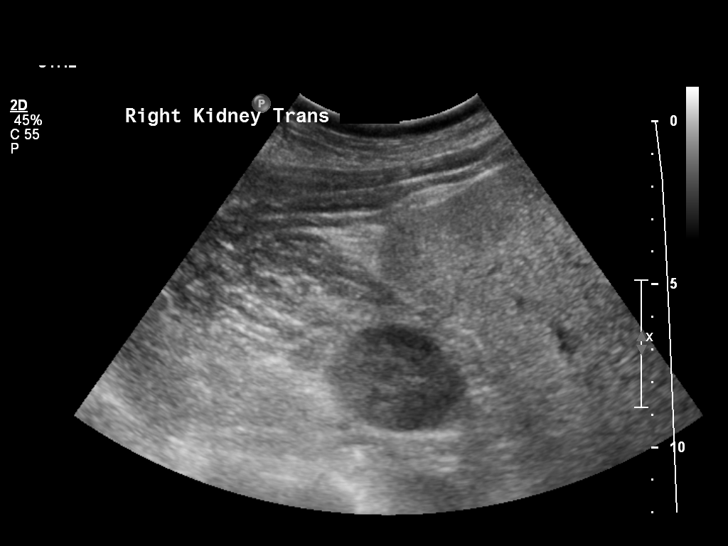

[14 of 25 positions shown; findings below may reference images not displayed]

FINDINGS: Liver is fatty and enlarged measuring 22.5 cm in maximum sagittal dimension. Fatty infiltration limits evaluation for focal hepatic mass. There is no intra or extrahepatic biliary ductal dilatation. Common bile duct measures 4 mm. No sludge or shadowing gallstones are seen. There is no gallbladder wall thickening or pericholecystic fluid. Pancreas is incompletely visualized due to artifact from overlying bowel gas. Right kidney measures 12 cm and there is an 8 mm shadowing upper pole calculus.

Visualized abdominal aorta is without aneurysmal dilatation. IVC is normal. Portal vein measures 10 mm in diameter and demonstrates hepatopetal flow. Hepatic veins are also patent. There is no ascites.
IMPRESSION: 1. Fatty and enlarged liver. 

2. No evidence of cholelithiasis or acute cholecystitis. 

3. Pancreas incompletely visualized due to artifact from overlying bowel gas.

## 2021-01-16 IMAGING — US DUPLEX VENOUS US UNILATERAL
1 series · 14 of 24 positions shown · non-contrast
Comparison: None available.

﻿EXAM:  DUPLEX VENOUS US UNILATERAL
INDICATION: Pain and swelling.

[Series 1: duplex venous us unilateral · portal-venous · 14 of 95 slices shown]
[im 1/95]
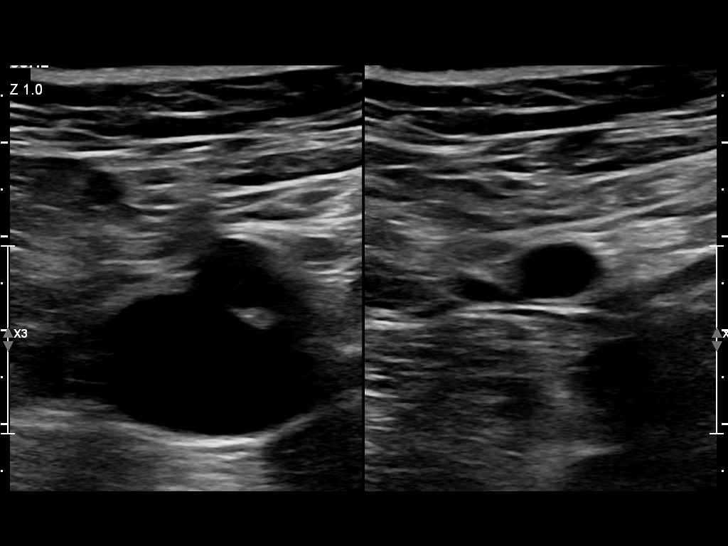
[im 9/95]
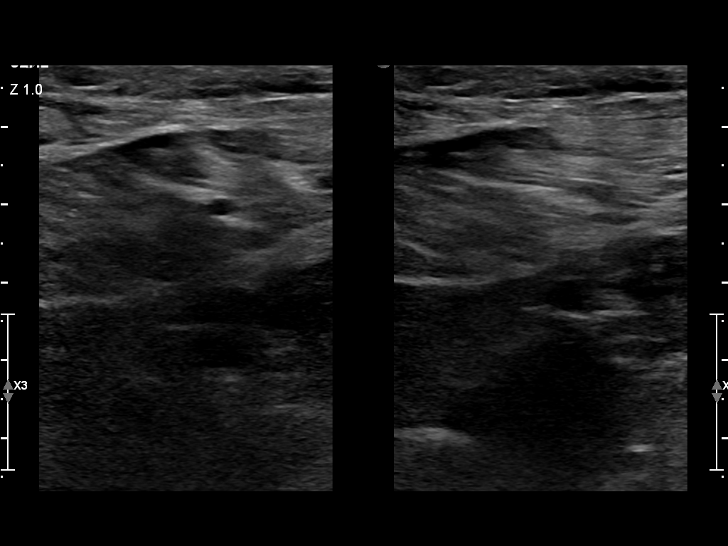
[im 17/95]
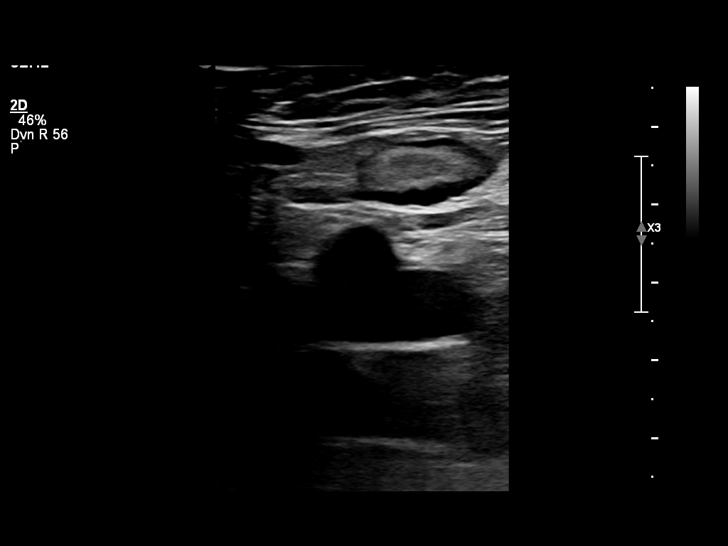
[im 25/95]
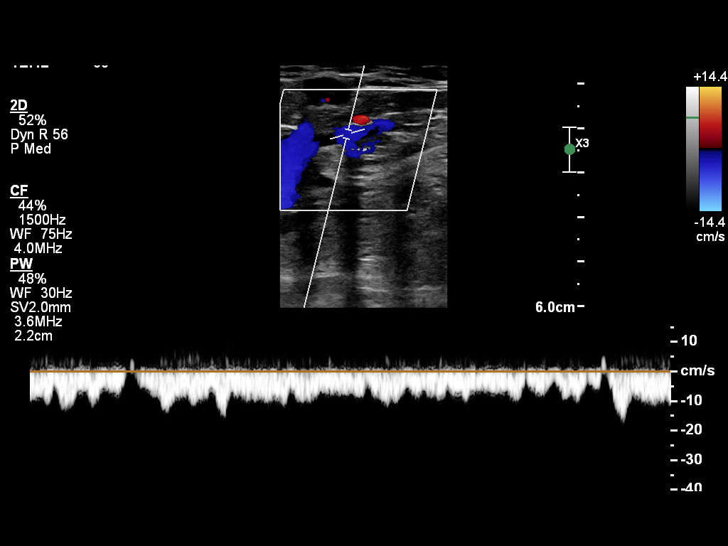
[im 29/95]
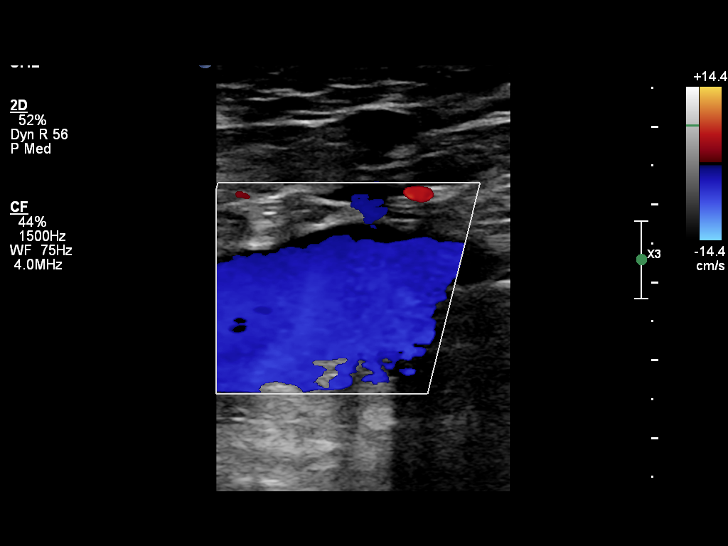
[im 37/95]
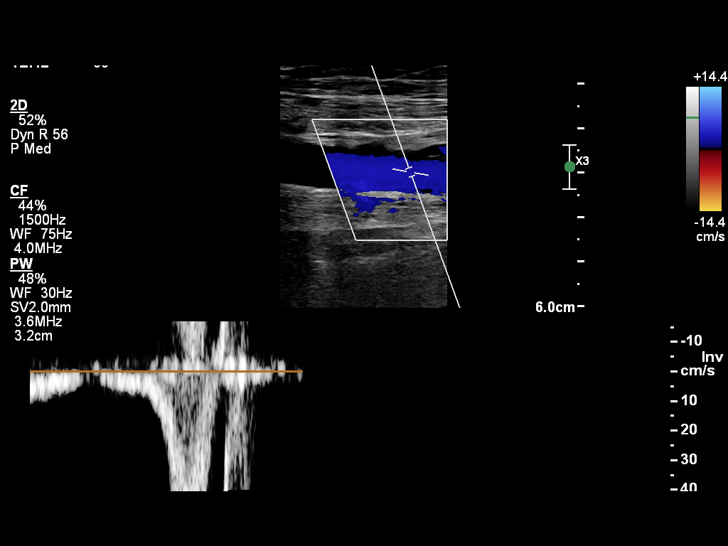
[im 45/95]
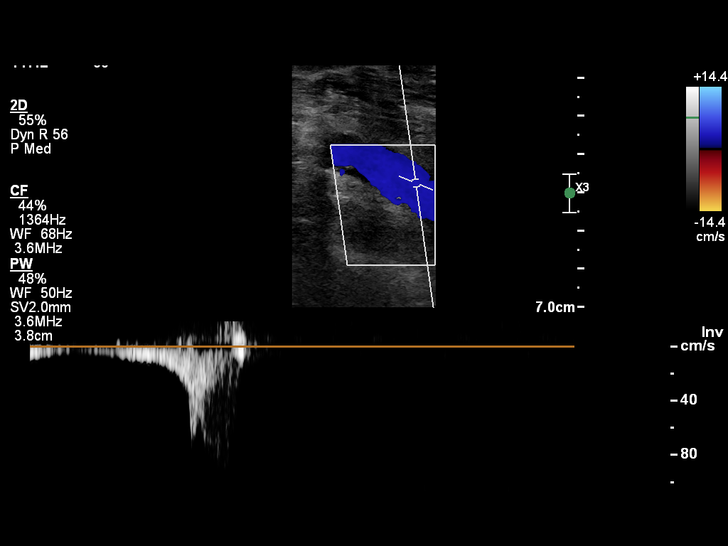
[im 50/95]
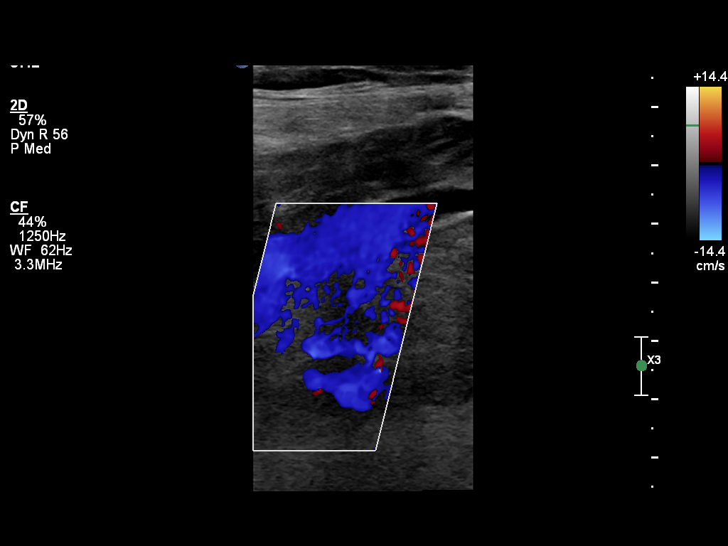
[im 58/95]
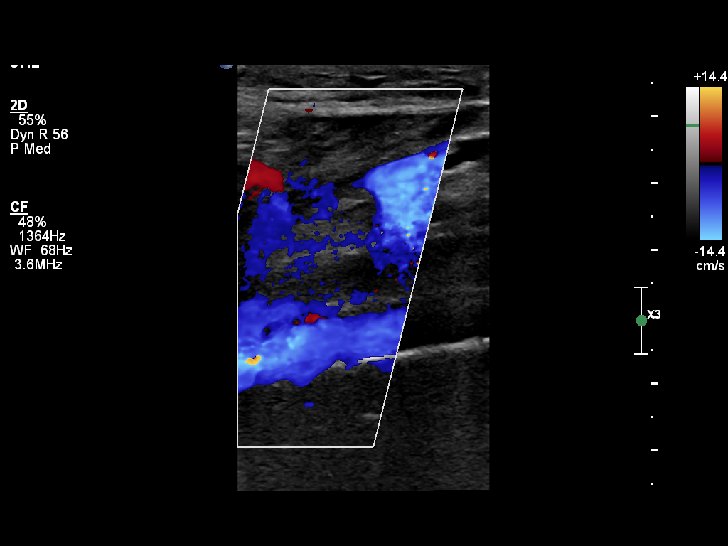
[im 66/95]
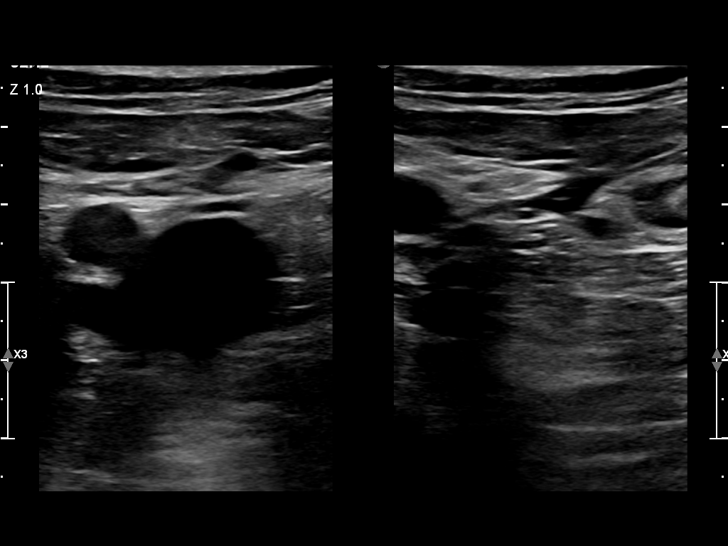
[im 74/95]
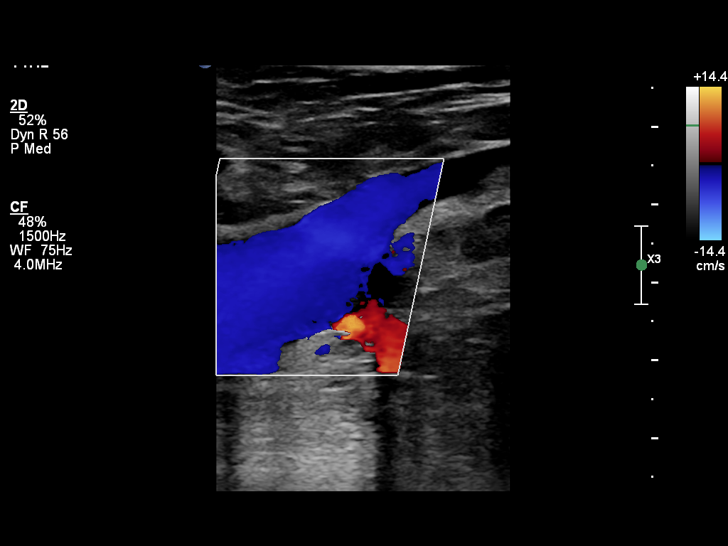
[im 78/95]
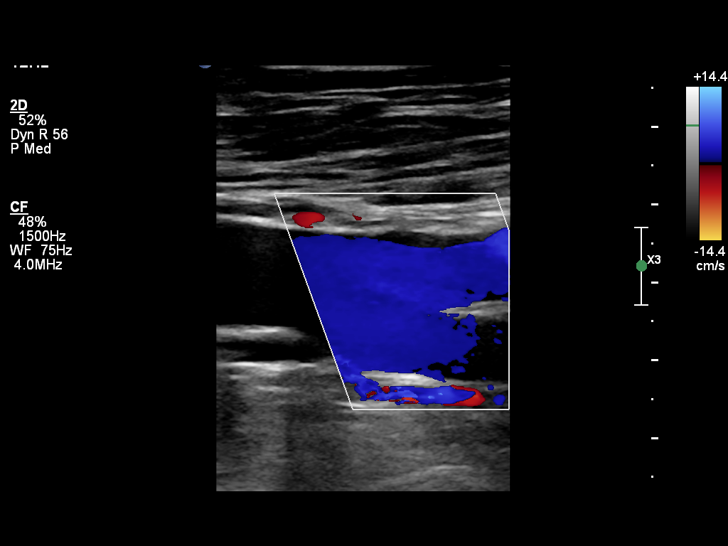
[im 86/95]
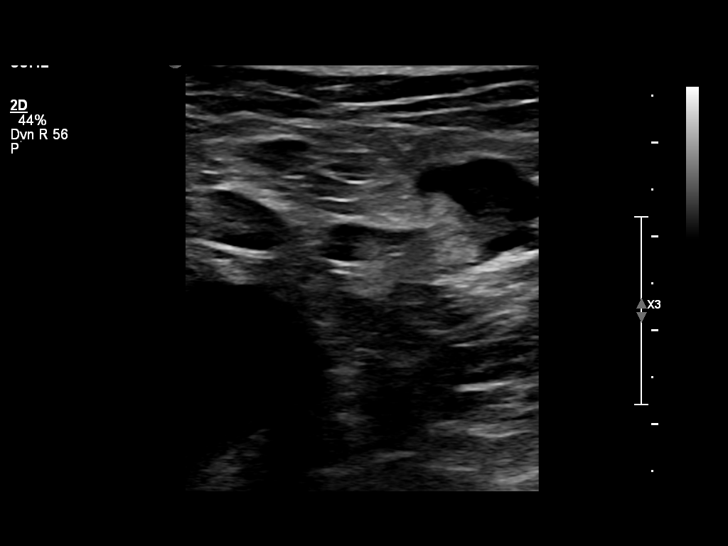
[im 95/95]
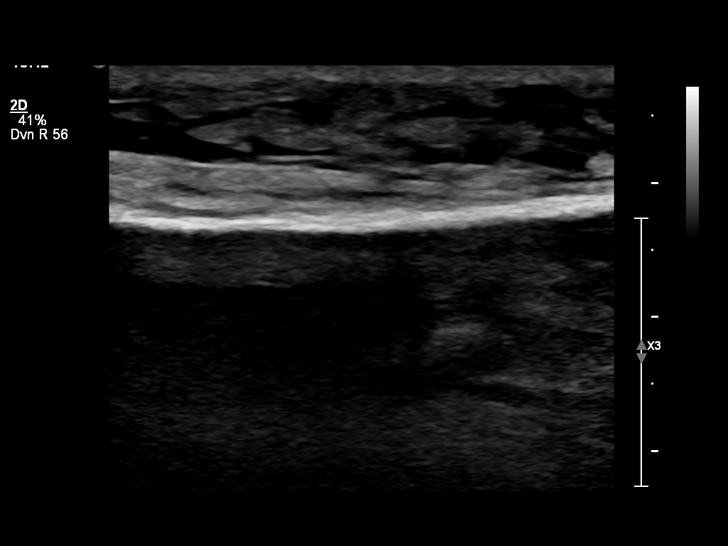

[14 of 24 positions shown; findings below may reference images not displayed]

FINDINGS: Sonographic evaluation of the left lower extremity was performed utilizing grayscale, color flow and pulsed Doppler techniques. 

The common femoral, superficial femoral and popliteal veins demonstrate normal compressibility, respiratory phasicity and augmentation. Spectral Doppler analysis is also unremarkable. The calf veins are also patent. Moderate edema is noted within the mid and distal calf. The contralateral common femoral vein also demonstrates normal compressibility.
IMPRESSION: No evidence of left lower extremity deep venous thrombus.

## 2021-06-22 ENCOUNTER — Other Ambulatory Visit (INDEPENDENT_AMBULATORY_CARE_PROVIDER_SITE_OTHER): Payer: Self-pay | Admitting: Family Medicine

## 2021-06-22 DIAGNOSIS — Z87891 Personal history of nicotine dependence: Secondary | ICD-10-CM

## 2021-07-08 ENCOUNTER — Other Ambulatory Visit (HOSPITAL_COMMUNITY): Payer: Self-pay | Admitting: Family

## 2021-07-08 DIAGNOSIS — K7460 Unspecified cirrhosis of liver: Secondary | ICD-10-CM

## 2021-07-17 ENCOUNTER — Other Ambulatory Visit: Payer: Managed Care, Other (non HMO) | Attending: Internal Medicine | Admitting: Internal Medicine

## 2021-07-17 DIAGNOSIS — K219 Gastro-esophageal reflux disease without esophagitis: Secondary | ICD-10-CM | POA: Insufficient documentation

## 2021-07-17 DIAGNOSIS — K2950 Unspecified chronic gastritis without bleeding: Secondary | ICD-10-CM | POA: Insufficient documentation

## 2021-07-22 DIAGNOSIS — K2950 Unspecified chronic gastritis without bleeding: Secondary | ICD-10-CM

## 2021-07-22 LAB — SURGICAL PATHOLOGY SPECIMEN

## 2021-08-05 ENCOUNTER — Encounter (INDEPENDENT_AMBULATORY_CARE_PROVIDER_SITE_OTHER): Payer: Self-pay | Admitting: OTOLARYNGOLOGY

## 2021-08-05 ENCOUNTER — Other Ambulatory Visit: Payer: Self-pay

## 2021-08-05 ENCOUNTER — Ambulatory Visit (INDEPENDENT_AMBULATORY_CARE_PROVIDER_SITE_OTHER): Payer: Managed Care, Other (non HMO) | Admitting: OTOLARYNGOLOGY

## 2021-08-05 VITALS — Ht 73.0 in | Wt 245.0 lb

## 2021-08-05 DIAGNOSIS — H906 Mixed conductive and sensorineural hearing loss, bilateral: Secondary | ICD-10-CM

## 2021-08-05 DIAGNOSIS — H6983 Other specified disorders of Eustachian tube, bilateral: Secondary | ICD-10-CM

## 2021-08-05 DIAGNOSIS — H6120 Impacted cerumen, unspecified ear: Secondary | ICD-10-CM

## 2021-08-05 DIAGNOSIS — E785 Hyperlipidemia, unspecified: Secondary | ICD-10-CM | POA: Insufficient documentation

## 2021-08-05 DIAGNOSIS — F17200 Nicotine dependence, unspecified, uncomplicated: Secondary | ICD-10-CM | POA: Insufficient documentation

## 2021-08-05 DIAGNOSIS — J342 Deviated nasal septum: Secondary | ICD-10-CM

## 2021-08-05 DIAGNOSIS — H6123 Impacted cerumen, bilateral: Secondary | ICD-10-CM

## 2021-08-05 DIAGNOSIS — J309 Allergic rhinitis, unspecified: Secondary | ICD-10-CM

## 2021-08-05 DIAGNOSIS — H7291 Unspecified perforation of tympanic membrane, right ear: Secondary | ICD-10-CM

## 2021-08-05 MED ORDER — MONTELUKAST 10 MG TABLET
10.0000 mg | ORAL_TABLET | Freq: Every day | ORAL | 3 refills | Status: DC
Start: 2021-08-05 — End: 2022-08-18

## 2021-08-05 NOTE — Procedures (Signed)
[facility]  [address]  [place] [address]    Procedure Note    Name: Miguel Hendricks MRN:  [id number]   Date: 08/05/2021 Age: 71 y.o.       31231 - NASAL ENDOSCOPY DIAGNOSTIC UNILATERAL OR BILATERAL (AMB ONLY)  Performed by: Conchita Paris, DO  Authorized by: Conchita Paris, DO     Time Out:     Immediately before the procedure, a time out was called:  Yes    Patient verified:  Yes    Procedure Verified:  Yes    Site Verified:  Yes  Documentation:      Indications for procedure: Monitor chronic disease    Anesthesia: Oxymetazoline nasal spray    Description: Nasal endoscopy with rigid scope was performed with examination of the  septum, inferior, middle, and superior meatus, turbinates, sphenoethmoidal recess, and nasopharynx.     There were no polyps, pus, or granulation tissue noted.  ET orifices and nasopharynx were normal.     Findings: Allergic rhinitis, DNS    The patient tolerated the procedure well.        20254 - REMOVAL IMPACTED CERUMEN W/ INSTRUMENT, UNILATERAL (AMB ONLY-PD)  Performed by: Conchita Paris, DO  Authorized by: Conchita Paris, DO     Time Out:     Immediately before the procedure, a time out was called:  Yes    Patient verified:  Yes    Procedure Verified:  Yes    Site Verified:  Yes  Documentation:      Procedure: Cerumen cleaning  Pre-op Dx: Cerumen impaction      Bilateral EAC(s) examined under binocular microscopy.  Cerumen and/or debris was cleaned from the canal(s) using curettes, suction, and alligator forceps.  Patient tolerated procedure well.        Conchita Paris, DO

## 2021-08-05 NOTE — H&P (Signed)
[facility]  [address]  [place] [address]  Phone: [phone]  Fax: [phone]      Encounter Date: 08/05/2021    Patient ID: Miguel Hendricks  MRN: [id number]    DOB: 1950-07-15  Age: 71 y.o. male     Progress Note       Referring Provider:  Cleda Clarks, DO    Reason for Visit:   Chief Complaint   Patient presents with   . Follow Up 6 Months     6 month rc on ears. Did not have audio done.        History of Present Illness:  Miguel Hendricks is a 71 y.o. 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 He did not have the audiogram ordered. No recent otorrhea. His ears pop on occasion.   Tympanogram: AD Type B/Inc CV     AS Type C      Patient History:  Patient Active Problem List   Diagnosis   . Abnormal cardiovascular stress test   . Dyslipidemia   . Tobacco use disorder     Current Outpatient Medications   Medication Sig   . aspirin 81 mg Oral Tablet, Chewable Chew 1 Tablet (81 mg total)   . cetirizine (ZYRTEC) 10 mg Oral Tablet Take 1 Tablet (10 mg total) by mouth Once a day   . cyclobenzaprine (FLEXERIL) 10 mg Oral Tablet Take 1 Tablet (10 mg total) by mouth Every night   . ENBREL SURECLICK 50 mg/mL (1 mL) Subcutaneous Pen Injector    . famotidine (PEPCID) 40 mg Oral Tablet Take 1 Tablet (40 mg total) by mouth Every morning   . fluticasone propionate (FLONASE) 50 mcg/actuation Nasal Spray, Suspension Administer 1 Spray into each nostril Once a day   . furosemide (LASIX) 40 mg Oral Tablet TAKE 1 TABLET EVERY DAY IN THE MORNING AS NEEDED FOR EDEMA   . glipiZIDE (GLUCOTROL XL) 5 mg Oral Tablet Extended Rel 24 hr TAKE 1 (ONE) TABLET BEFORE DINNER   . GLYXAMBI 25-5 mg Oral Tablet Take 1 Tablet by mouth Once a day   . losartan-hydrochlorothiazide (HYZAAR) 50-12.5 mg Oral Tablet Take 1 Tablet by mouth Once a day   . MetFORMIN (GLUCOPHAGE) 1,000 mg Oral Tablet Take 1 Tablet (1,000 mg total) by mouth Twice daily   . montelukast (SINGULAIR) 10 mg Oral Tablet Take 1 Tablet (10 mg total)  by mouth Once a day   . naproxen sodium (ANAPROX) 550 mg Oral Tablet Take 1 Tablet (550 mg total) by mouth Twice daily   . omeprazole (PRILOSEC) 40 mg Oral Capsule, Delayed Release(E.C.) TAKE 1 CAPSULE BY MOUTH EVERY DAY BEFORE DINNER   . pioglitazone (ACTOS) 30 mg Oral Tablet Take 1 Tablet (30 mg total) by mouth Once a day   . pravastatin (PRAVACHOL) 20 mg Oral Tablet Take 1 Tablet (20 mg total) by mouth   . propranoloL (INDERAL) 10 mg Oral Tablet Take 1 Tablet (10 mg total) by mouth Twice daily   . simvastatin (ZOCOR) 5 mg Oral Tablet Take 1 Tablet (5 mg total) by mouth      No Known Allergies  Past Medical History:   Diagnosis Date   . Allergic rhinitis    . Diabetes mellitus (CMS HCC)    . Esophageal reflux    . Essential hypertension      History reviewed. No pertinent surgical history.  Family Medical History:    None  Social History     Tobacco Use   . Smoking status: Never   . Smokeless tobacco: Never       Review of Systems:  Review of Systems   Constitutional: Negative.        Physical Exam:  Ht 1.854 m (6\' 1" )   Wt 111 kg (245 lb)   BMI 32.32 kg/m       Physical Exam  Constitutional:       Appearance: Normal appearance. He is well-developed, well-groomed and normal weight.   HENT:      Head: Normocephalic and atraumatic.      Right Ear: Hearing, ear canal and external ear normal. There is impacted cerumen. Tympanic membrane is perforated.      Left Ear: Hearing, ear canal and external ear normal. There is impacted cerumen. Tympanic membrane is retracted.      Nose: Septal deviation and mucosal edema present.      Right Turbinates: Enlarged.      Left Turbinates: Enlarged.      Mouth/Throat:      Lips: Pink.      Mouth: Mucous membranes are moist.      Pharynx: Oropharynx is clear. Uvula midline.   Eyes:      Extraocular Movements: Extraocular movements intact.   Neck:      Trachea: Phonation normal.   Pulmonary:      Effort: Pulmonary effort is normal.   Musculoskeletal:      Cervical back:  Normal range of motion and neck supple.   Lymphadenopathy:      Cervical: No cervical adenopathy.   Skin:     General: Skin is warm.   Neurological:      Mental Status: He is alert and oriented to person, place, and time.      Cranial Nerves: Cranial nerves 2-12 are intact. No facial asymmetry.   Psychiatric:         Attention and Perception: Attention normal.         Mood and Affect: Mood normal.         Speech: Speech normal.         Behavior: Behavior normal. Behavior is cooperative.         Assessment:  ENCOUNTER DIAGNOSES     ICD-10-CM   1. ETD (Eustachian tube dysfunction), bilateral  H69.83   2. 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 Nasal septal deviation  J34.2   4. Allergic rhinitis  J30.9   5. Tympanic membrane perforation, right  H72.91   6. Impacted cerumen, unspecified laterality  H61.20       Plan:  Medical records reviewed on 08/05/2021.  AU cerumen debrided. Pt declines audiogram. Ears stable.   Orders Placed This Encounter   . 08/07/2021 - NASAL ENDOSCOPY DIAGNOSTIC UNILATERAL OR BILATERAL (AMB ONLY)   . 26948 - REMOVAL IMPACTED CERUMEN W/ INSTRUMENT, UNILATERAL (AMB ONLY-PD)   . montelukast (SINGULAIR) 10 mg Oral Tablet   Continue Flonase, Singulair, zyrtec  Return in about 6 months (around 02/05/2022).    02/07/2022, PA-C  08/05/2021, 11:20     The advanced practice clinician's documentation was reviewed/amended in its entirety with the assessment and plan portion completely performed independently by me during this separate encounter.

## 2021-08-07 ENCOUNTER — Ambulatory Visit (HOSPITAL_BASED_OUTPATIENT_CLINIC_OR_DEPARTMENT_OTHER)
Admission: RE | Admit: 2021-08-07 | Discharge: 2021-08-07 | Disposition: A | Discharge status: Home / Self Care | Payer: Managed Care, Other (non HMO) | Source: Ambulatory Visit | Attending: Internal Medicine | Admitting: Internal Medicine

## 2021-08-07 ENCOUNTER — Other Ambulatory Visit: Payer: Self-pay

## 2021-08-07 ENCOUNTER — Encounter (HOSPITAL_COMMUNITY): Payer: Self-pay

## 2021-08-07 ENCOUNTER — Ambulatory Visit
Admission: RE | Admit: 2021-08-07 | Discharge: 2021-08-07 | Disposition: A | Discharge status: Home / Self Care | Payer: Managed Care, Other (non HMO) | Source: Ambulatory Visit | Attending: Family Medicine | Admitting: Family Medicine

## 2021-08-07 DIAGNOSIS — Z87891 Personal history of nicotine dependence: Secondary | ICD-10-CM | POA: Insufficient documentation

## 2021-08-07 DIAGNOSIS — K7460 Unspecified cirrhosis of liver: Secondary | ICD-10-CM

## 2021-08-08 DIAGNOSIS — J8410 Pulmonary fibrosis, unspecified: Secondary | ICD-10-CM

## 2021-08-08 DIAGNOSIS — Z87891 Personal history of nicotine dependence: Secondary | ICD-10-CM

## 2021-08-08 DIAGNOSIS — Z122 Encounter for screening for malignant neoplasm of respiratory organs: Secondary | ICD-10-CM

## 2021-08-14 ENCOUNTER — Other Ambulatory Visit (INDEPENDENT_AMBULATORY_CARE_PROVIDER_SITE_OTHER): Payer: Self-pay | Admitting: Family Medicine

## 2021-08-14 DIAGNOSIS — I714 Abdominal aortic aneurysm, without rupture, unspecified (CMS HCC): Secondary | ICD-10-CM

## 2021-09-09 ENCOUNTER — Ambulatory Visit (HOSPITAL_COMMUNITY): Payer: Managed Care, Other (non HMO)

## 2021-09-16 ENCOUNTER — Encounter (INDEPENDENT_AMBULATORY_CARE_PROVIDER_SITE_OTHER): Payer: Self-pay | Admitting: Family Medicine

## 2021-09-17 ENCOUNTER — Encounter (INDEPENDENT_AMBULATORY_CARE_PROVIDER_SITE_OTHER): Payer: Self-pay | Admitting: Family Medicine

## 2021-09-17 ENCOUNTER — Ambulatory Visit (INDEPENDENT_AMBULATORY_CARE_PROVIDER_SITE_OTHER): Payer: Managed Care, Other (non HMO) | Admitting: Family Medicine

## 2021-09-17 ENCOUNTER — Other Ambulatory Visit: Payer: Self-pay

## 2021-09-17 VITALS — BP 129/72 | HR 62 | Temp 98.4°F | Ht 73.0 in | Wt 245.0 lb

## 2021-09-17 DIAGNOSIS — E119 Type 2 diabetes mellitus without complications: Secondary | ICD-10-CM

## 2021-09-17 DIAGNOSIS — E785 Hyperlipidemia, unspecified: Secondary | ICD-10-CM

## 2021-09-17 DIAGNOSIS — I714 Abdominal aortic aneurysm, without rupture, unspecified (CMS HCC): Secondary | ICD-10-CM

## 2021-09-17 DIAGNOSIS — F17200 Nicotine dependence, unspecified, uncomplicated: Secondary | ICD-10-CM

## 2021-09-17 MED ORDER — TAMSULOSIN 0.4 MG CAPSULE
0.4000 mg | ORAL_CAPSULE | Freq: Every evening | ORAL | 0 refills | Status: DC
Start: 2021-09-17 — End: 2021-12-16

## 2021-09-17 MED ORDER — CYCLOBENZAPRINE 10 MG TABLET
10.0000 mg | ORAL_TABLET | Freq: Every evening | ORAL | 0 refills | Status: DC
Start: 2021-09-17 — End: 2022-01-20

## 2021-09-17 MED ORDER — LOSARTAN 50 MG-HYDROCHLOROTHIAZIDE 12.5 MG TABLET
1.0000 | ORAL_TABLET | Freq: Every day | ORAL | 0 refills | Status: DC
Start: 2021-09-17 — End: 2021-12-16

## 2021-09-17 NOTE — Nursing Note (Signed)
09/17/21 1314   Depression Screen   Little interest or pleasure in doing things. 0   Feeling down, depressed, or hopeless 0   PHQ 2 Total 0

## 2021-09-17 NOTE — Progress Notes (Signed)
FAMILY MEDICINE, MEDICAL OFFICE BUILDING  [address]  [place] [id number]       Name: Miguel Hendricks MRN:  [id number]   Date: 09/17/2021 Age: 71 y.o.          Provider: Cleda ClarksJoni M Samrat Hayward, DO    Reason for visit: Follow Up 4 Months      History of Present Illness:  Miguel Bossierdwin K Damore is a 71 y.o. male here today for chronic disease management.     Last visit patient had a low dose ct scan for smoking history it was benign but mentioned aortic aneusrym, he will be having angio this month concerning this, says hes never been told this before.    Patient has the following specialist  Dr.Patel  Dr.Safi, endocrine    Last colonoscopy/cologuard: Dr.Patel      Historical Data    Past Medical History:  Patient Active Problem List   Diagnosis   . Abnormal cardiovascular stress test   . Dyslipidemia   . Tobacco use disorder      Past Surgical History:  Past Surgical History:   Procedure Laterality Date   . HX TONSILLECTOMY  1959   . SURGERY SCROTAL / TESTICULAR  2004    [facility]         Allergies:  No Known Allergies  Medications:  Current Outpatient Medications   Medication Sig   . aspirin 81 mg Oral Tablet, Chewable Chew 1 Tablet (81 mg total)   . cetirizine (ZYRTEC) 10 mg Oral Tablet Take 1 Tablet (10 mg total) by mouth Once a day   . cyclobenzaprine (FLEXERIL) 10 mg Oral Tablet Take 1 Tablet (10 mg total) by mouth Every night for 90 days   . ENBREL SURECLICK 50 mg/mL (1 mL) Subcutaneous Pen Injector    . famotidine (PEPCID) 40 mg Oral Tablet Take 1 Tablet (40 mg total) by mouth Every morning   . fluticasone propionate (FLONASE) 50 mcg/actuation Nasal Spray, Suspension Administer 1 Spray into each nostril Once a day   . furosemide (LASIX) 40 mg Oral Tablet TAKE 1 TABLET EVERY DAY IN THE MORNING AS NEEDED FOR EDEMA   . glipiZIDE (GLUCOTROL XL) 5 mg Oral Tablet Extended Rel 24 hr TAKE 1 (ONE) TABLET BEFORE DINNER   . GLYXAMBI 25-5 mg Oral Tablet Take 1 Tablet by mouth Once a day   . losartan-hydrochlorothiazide (HYZAAR) 50-12.5  mg Oral Tablet Take 1 Tablet by mouth Once a day for 90 days   . MetFORMIN (GLUCOPHAGE) 1,000 mg Oral Tablet Take 1 Tablet (1,000 mg total) by mouth Twice daily   . montelukast (SINGULAIR) 10 mg Oral Tablet Take 1 Tablet (10 mg total) by mouth Once a day   . naproxen sodium (ANAPROX) 550 mg Oral Tablet Take 1 Tablet (550 mg total) by mouth Twice daily   . omeprazole (PRILOSEC) 40 mg Oral Capsule, Delayed Release(E.C.) TAKE 1 CAPSULE BY MOUTH EVERY DAY BEFORE DINNER   . pioglitazone (ACTOS) 30 mg Oral Tablet Take 1 Tablet (30 mg total) by mouth Once a day   . pravastatin (PRAVACHOL) 20 mg Oral Tablet Take 1 Tablet (20 mg total) by mouth   . propranoloL (INDERAL) 10 mg Oral Tablet Take 1 Tablet (10 mg total) by mouth Twice daily   . simvastatin (ZOCOR) 5 mg Oral Tablet Take 1 Tablet (5 mg total) by mouth   . tamsulosin (FLOMAX) 0.4 mg Oral Capsule Take 1 Capsule (0.4 mg total) by mouth Every evening after dinner for 90 days  Family History:  Family Medical History:    None         Social History:  Social History     Socioeconomic History   . Marital status: Married   Tobacco Use   . Smoking status: Former     Packs/day: 1.00     Years: 50.00     Pack years: 50.00     Types: Cigarettes     Quit date: 2021     Years since quitting: 2.3   . Smokeless tobacco: Never           Review of Systems:  Review of Systems   Constitutional: Negative.  Negative for chills, fever and weight loss.   HENT: Negative.  Negative for congestion, ear pain, hearing loss, sinus pain and sore throat.    Eyes: Negative.  Negative for blurred vision, pain and redness.   Respiratory: Negative.  Negative for cough, shortness of breath and wheezing.    Cardiovascular: Negative.  Negative for chest pain and palpitations.   Gastrointestinal: Negative.  Negative for abdominal pain, blood in stool, constipation, diarrhea, heartburn, nausea and vomiting.   Genitourinary: Negative.  Negative for flank pain, frequency, hematuria and urgency.    Musculoskeletal: Negative.  Negative for back pain, joint pain and neck pain.   Skin: Negative.  Negative for itching and rash.   Neurological: Negative.  Negative for speech change, seizures, weakness and headaches.   Endo/Heme/Allergies: Negative.    Psychiatric/Behavioral: Negative.         Physical Exam:  Vital Signs:  Vitals:    09/17/21 1309   BP: 129/72   Pulse: 62   Temp: 36.9 C (98.4 F)   TempSrc: Temporal   SpO2: 95%   Weight: 111 kg (245 lb)   Height: 1.854 m (6\' 1" )   BMI: 32.39     Physical Exam  Vitals and nursing note reviewed.   Constitutional:       General: He is not in acute distress.     Appearance: Normal appearance. He is not ill-appearing.   HENT:      Head: Normocephalic and atraumatic.      Right Ear: External ear normal.      Left Ear: External ear normal.      Nose: Nose normal.      Mouth/Throat:      Mouth: Mucous membranes are moist.      Pharynx: Oropharynx is clear.   Eyes:      Pupils: Pupils are equal, round, and reactive to light.   Cardiovascular:      Rate and Rhythm: Normal rate and regular rhythm.      Pulses: Normal pulses.      Heart sounds: No murmur heard.  Pulmonary:      Effort: Pulmonary effort is normal. No respiratory distress.      Breath sounds: Normal breath sounds. No wheezing.   Abdominal:      General: Bowel sounds are normal.      Palpations: Abdomen is soft.      Tenderness: There is no abdominal tenderness.   Musculoskeletal:         General: Normal range of motion.      Cervical back: Normal range of motion and neck supple.   Skin:     General: Skin is warm and dry.      Findings: No lesion or rash.   Neurological:      General: No focal deficit present.  Mental Status: He is alert and oriented to person, place, and time. Mental status is at baseline.      Gait: Gait normal.   Psychiatric:         Mood and Affect: Mood normal.         Behavior: Behavior normal.         Thought Content: Thought content normal.         Judgment: Judgment normal.           Assessment:    ICD-10-CM    1. Abdominal aortic aneurysm (AAA) without rupture, unspecified part (CMS HCC)  I71.40       2. Dyslipidemia  E78.5       3. Tobacco use disorder  F17.200       4. Type 2 diabetes mellitus (CMS HCC)  E11.9            Plan:  Orders Placed This Encounter   . losartan-hydrochlorothiazide (HYZAAR) 50-12.5 mg Oral Tablet   . cyclobenzaprine (FLEXERIL) 10 mg Oral Tablet   . tamsulosin (FLOMAX) 0.4 mg Oral Capsule     Pt does regular labs with Dr.Safi, will have these sent to [place]   awaiting ct angio results  Continue current meds and management.  Return to clinic 6 months.              Return in about 6 months (around 03/20/2022) for chronic disease management.    Cleda Clarks, DO     Portions of this note may be dictated using voice recognition software or a dictation service. Variances in spelling and vocabulary are possible and unintentional. Not all errors are caught/corrected. Please notify the There[facility] Parkin if any discrepancies are noted or if the meaning of any statement is not clear.

## 2021-09-17 NOTE — Nursing Note (Signed)
09/17/21 1314   Fall Risk Assessment   Do you feel unsteady when standing or walking? No   Do you worry about falling? No   Have you fallen in the past year? Yes   How many times have you fallen? Once   Were you ever injured from falling? Yes

## 2021-09-24 ENCOUNTER — Ambulatory Visit (HOSPITAL_BASED_OUTPATIENT_CLINIC_OR_DEPARTMENT_OTHER): Payer: Managed Care, Other (non HMO)

## 2021-09-24 ENCOUNTER — Ambulatory Visit
Admission: RE | Admit: 2021-09-24 | Discharge: 2021-09-24 | Disposition: A | Discharge status: Home / Self Care | Payer: Managed Care, Other (non HMO) | Source: Ambulatory Visit | Attending: Family Medicine | Admitting: Family Medicine

## 2021-09-24 ENCOUNTER — Other Ambulatory Visit: Payer: Self-pay

## 2021-09-24 DIAGNOSIS — I714 Abdominal aortic aneurysm, without rupture, unspecified: Secondary | ICD-10-CM | POA: Insufficient documentation

## 2021-09-24 LAB — CREATININE WITH EGFR
CREATININE: 0.83 mg/dL (ref 0.70–1.30)
ESTIMATED GFR: 95 mL/min/{1.73_m2} (ref >59)

## 2021-09-24 MED ORDER — IOHEXOL 350 MG IODINE/ML INTRAVENOUS SOLUTION
100.0000 mL | INTRAVENOUS | Status: AC
Start: 2021-09-24 — End: 2021-09-24
  Administered 2021-09-24: 100 mL via INTRAVENOUS

## 2021-09-25 ENCOUNTER — Encounter (INDEPENDENT_AMBULATORY_CARE_PROVIDER_SITE_OTHER): Payer: Self-pay | Admitting: Family Medicine

## 2021-10-14 IMAGING — US ABD LIMITED
1 series · 14 of 25 positions shown · non-contrast
Comparison: 05/24/2019.

﻿EXAM:  JOONHO PROFESSIONAL READ ABD U/S LMTD
INDICATION: Fatty liver.

[Series 1: abd limited · 14 of 74 slices shown]
[im 1/74]
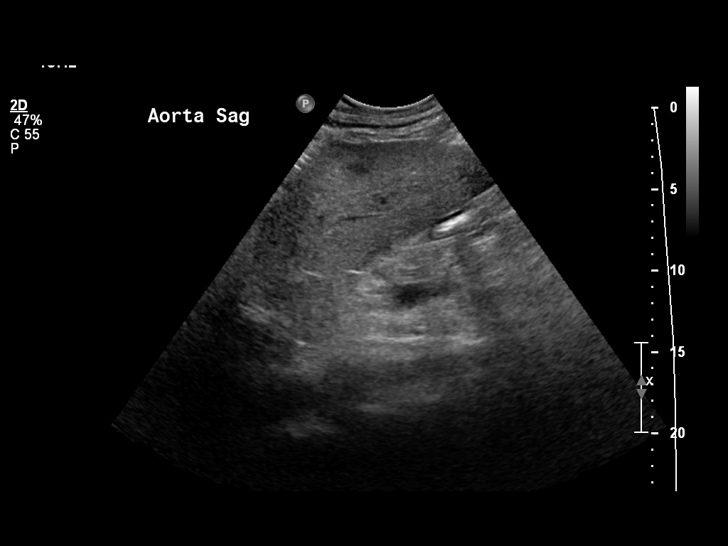
[im 7/74]
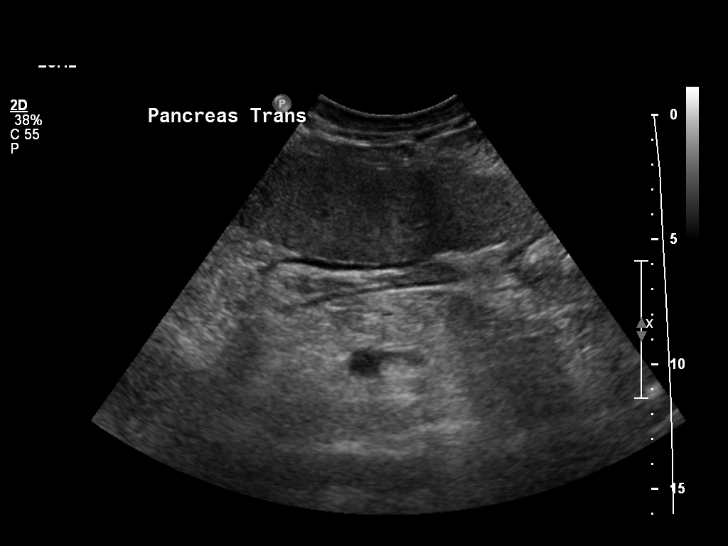
[im 13/74]
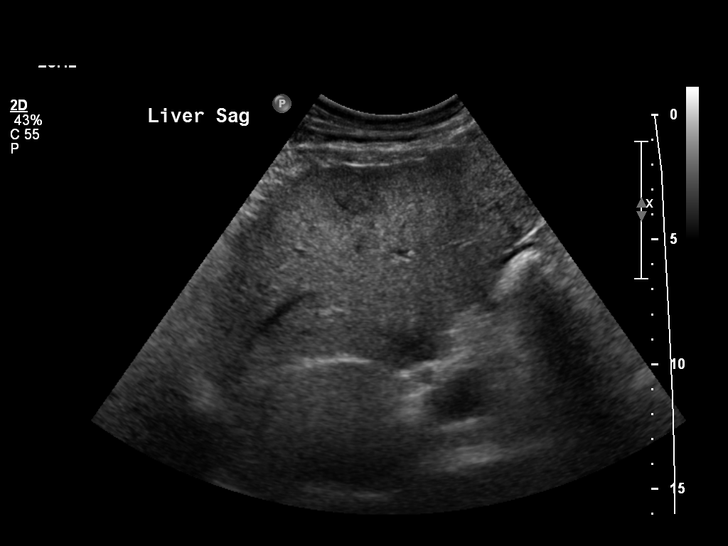
[im 19/74]
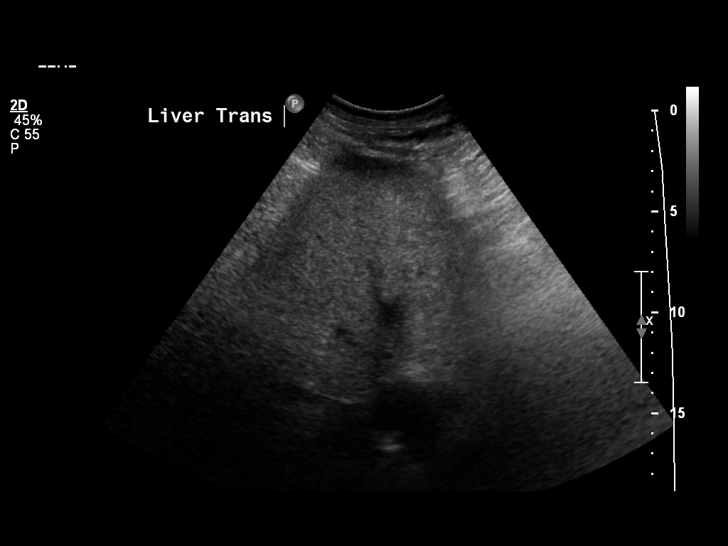
[im 25/74]
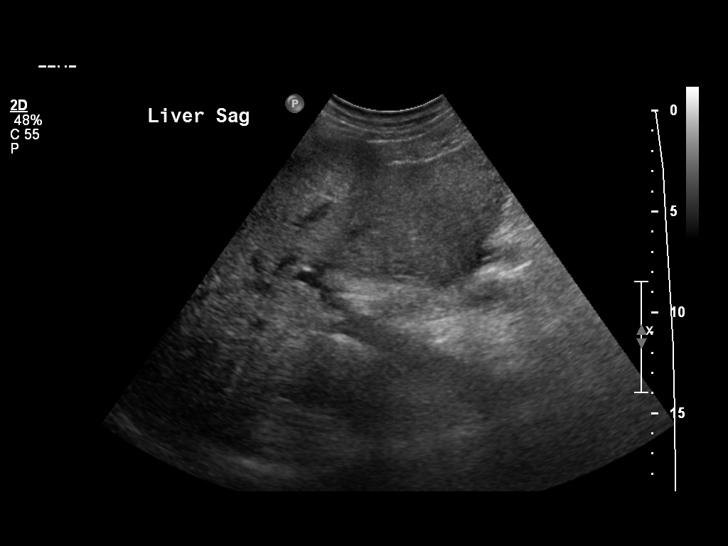
[im 28/74]
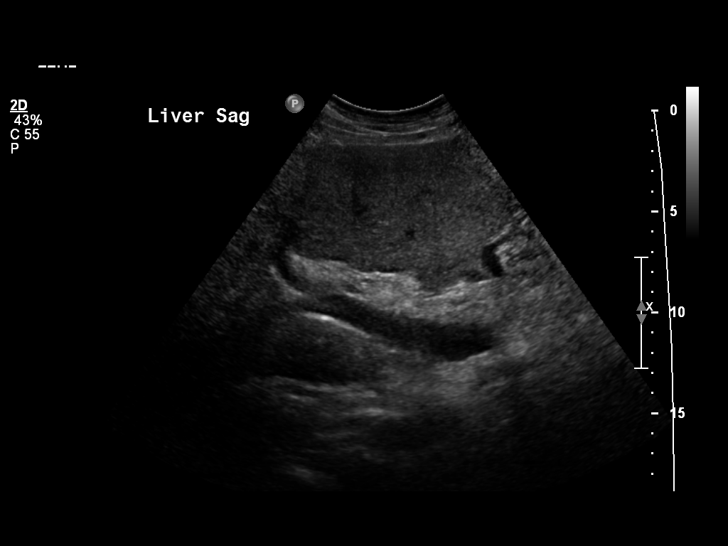
[im 34/74]
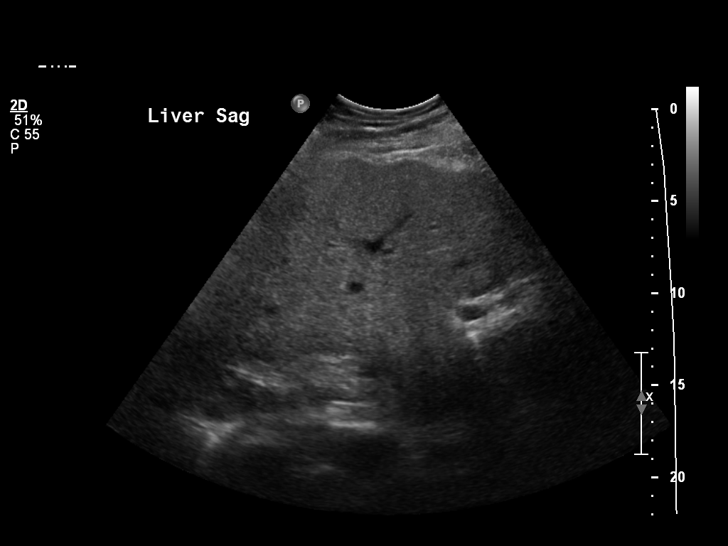
[im 40/74]
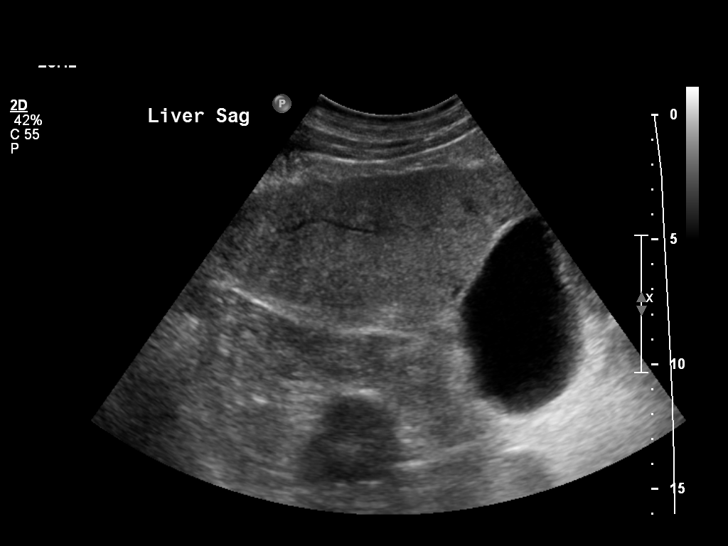
[im 46/74]
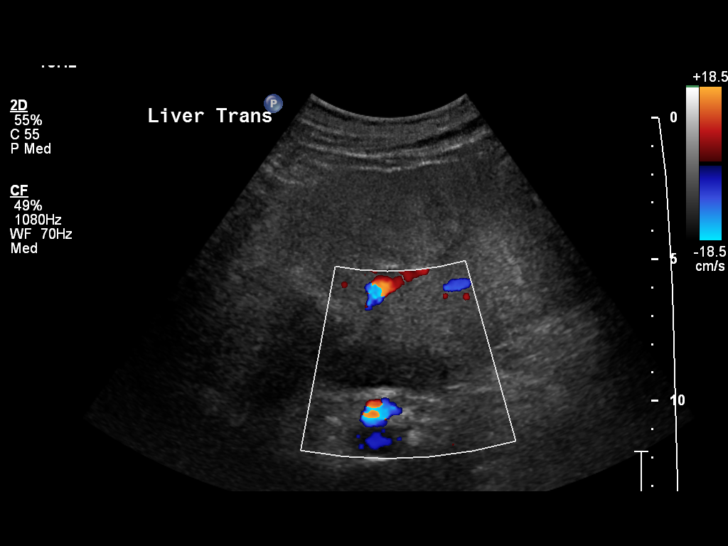
[im 49/74]
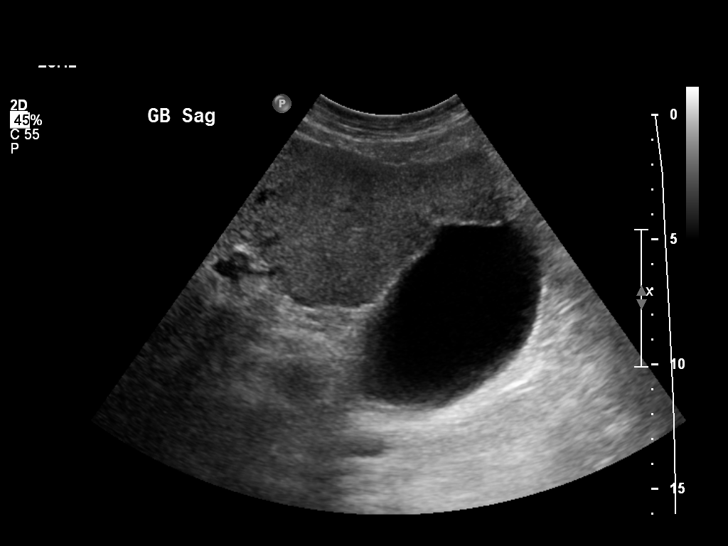
[im 55/74]
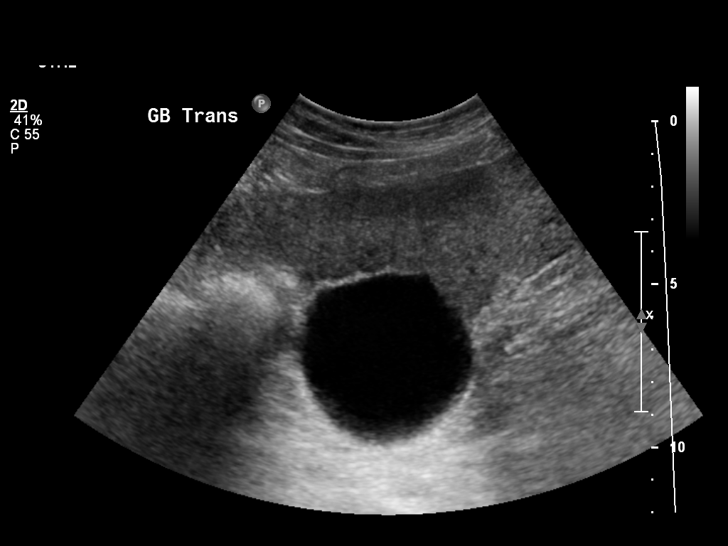
[im 61/74]
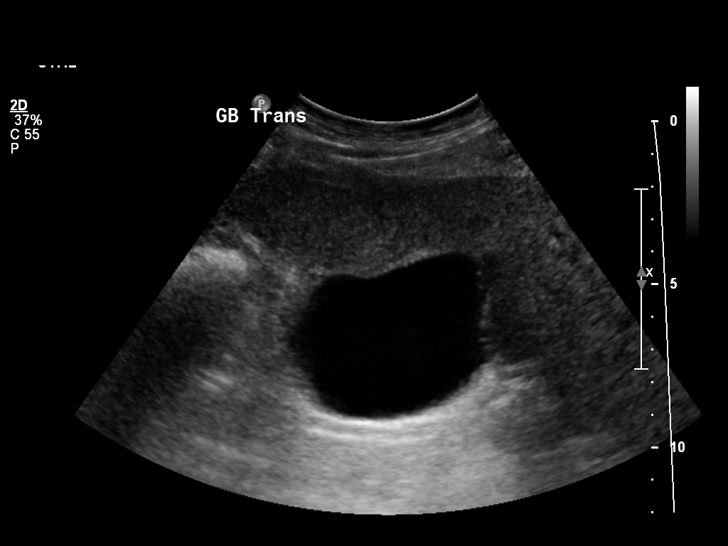
[im 67/74]
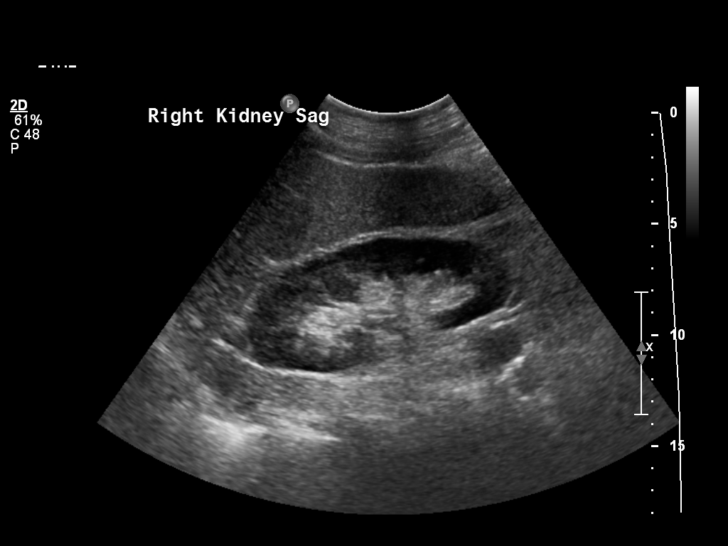
[im 74/74]
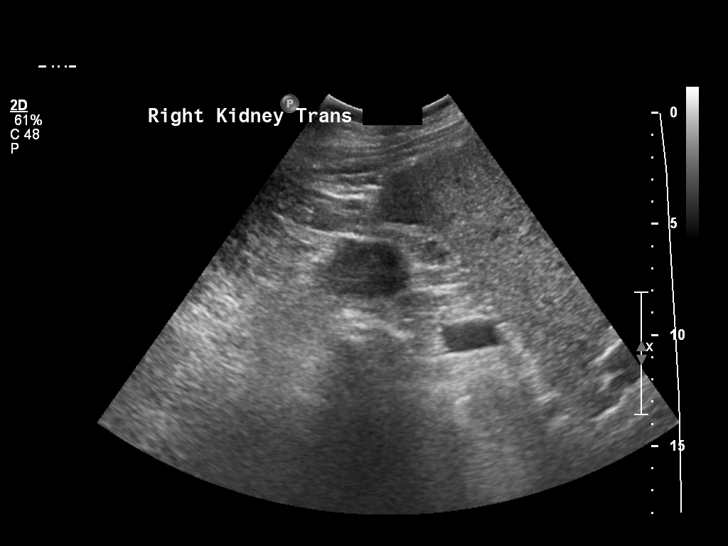

[14 of 25 positions shown; findings below may reference images not displayed]

FINDINGS: Liver is fatty and enlarged measuring 21.5 cm in maximum sagittal dimension. There are 2 hypoechoic areas within the left hepatic lobe, measuring up to 3 cm. There is no intra or extrahepatic biliary ductal dilatation. Common bile duct measures 3 mm. No sludge or shadowing gallstones are seen. There is no gallbladder wall thickening or pericholecystic fluid. Pancreas is incompletely visualized due to artifact from overlying bowel gas. Right kidney measures 12.5 cm and is normal.

Visualized abdominal aorta is without aneurysmal dilatation. IVC is normal. Portal vein measures 12 mm in diameter and demonstrates hepatopetal flow. Hepatic veins are also patent. There is no ascites.
IMPRESSION: 1. Fatty and enlarged liver. Two indeterminate hypoechoic areas within the left hepatic lobe. Follow-up contrast-enhanced CT scan as per liver mass protocol is recommended for better assessment.

2. No evidence of cholelithiasis or acute cholecystitis. 

3. Pancreas incompletely visualized due to artifact from overlying bowel gas.

## 2021-10-17 ENCOUNTER — Encounter (INDEPENDENT_AMBULATORY_CARE_PROVIDER_SITE_OTHER): Payer: Managed Care, Other (non HMO) | Admitting: Family Medicine

## 2021-12-03 ENCOUNTER — Other Ambulatory Visit (INDEPENDENT_AMBULATORY_CARE_PROVIDER_SITE_OTHER): Payer: Self-pay | Admitting: OTOLARYNGOLOGY

## 2021-12-14 ENCOUNTER — Other Ambulatory Visit (INDEPENDENT_AMBULATORY_CARE_PROVIDER_SITE_OTHER): Payer: Self-pay | Admitting: Family Medicine

## 2022-01-19 ENCOUNTER — Other Ambulatory Visit (INDEPENDENT_AMBULATORY_CARE_PROVIDER_SITE_OTHER): Payer: Self-pay | Admitting: Family Medicine

## 2022-01-21 ENCOUNTER — Ambulatory Visit: Payer: Managed Care, Other (non HMO) | Attending: Orthopaedic Surgery

## 2022-01-21 ENCOUNTER — Other Ambulatory Visit: Payer: Self-pay

## 2022-01-21 DIAGNOSIS — M25461 Effusion, right knee: Secondary | ICD-10-CM | POA: Insufficient documentation

## 2022-01-21 LAB — SYNOVIAL FLUID DIFFERENTIAL
NEUTROPHIL %: 91 %
OTHER CELL %: 9 %

## 2022-01-21 LAB — SYNOVIAL (JOINT) FLUID CRYSTALS

## 2022-01-21 LAB — SYNOVIAL FLUID: NUCLEATED CELLS, FLUID: 19696 /uL

## 2022-01-23 LAB — STERILE SITE CULTURE AND GRAM STAIN, AEROBIC: FLC: NO GROWTH

## 2022-01-26 LAB — STERILE SITE CULTURE AND GRAM STAIN, AEROBIC: GRAM STAIN: NONE SEEN

## 2022-02-10 ENCOUNTER — Other Ambulatory Visit: Payer: Self-pay

## 2022-02-10 ENCOUNTER — Ambulatory Visit (INDEPENDENT_AMBULATORY_CARE_PROVIDER_SITE_OTHER): Payer: Managed Care, Other (non HMO) | Admitting: OTOLARYNGOLOGY

## 2022-02-10 ENCOUNTER — Encounter (INDEPENDENT_AMBULATORY_CARE_PROVIDER_SITE_OTHER): Payer: Self-pay | Admitting: OTOLARYNGOLOGY

## 2022-02-10 VITALS — Ht 73.0 in | Wt 245.0 lb

## 2022-02-10 DIAGNOSIS — J310 Chronic rhinitis: Secondary | ICD-10-CM

## 2022-02-10 DIAGNOSIS — J301 Allergic rhinitis due to pollen: Secondary | ICD-10-CM

## 2022-02-10 DIAGNOSIS — H6123 Impacted cerumen, bilateral: Secondary | ICD-10-CM

## 2022-02-10 DIAGNOSIS — H7291 Unspecified perforation of tympanic membrane, right ear: Secondary | ICD-10-CM

## 2022-02-10 DIAGNOSIS — J342 Deviated nasal septum: Secondary | ICD-10-CM

## 2022-02-10 DIAGNOSIS — H6993 Unspecified Eustachian tube disorder, bilateral: Secondary | ICD-10-CM

## 2022-02-10 NOTE — Procedures (Signed)
[facility], [place]  [place] [id number]    Procedure Note    Name: Miguel Hendricks MRN:  [id number]   Date: 02/10/2022 Age: 71 y.o.  DOB:   Oct 02, 1950       01601 - REMOVAL IMPACTED CERUMEN W/ INSTRUMENT, UNILATERAL (AMB ONLY-PD)    Performed by: Dia Sitter, DO  Authorized by: Dia Sitter, DO    Time Out:     Immediately before the procedure, a time out was called:  Yes    Patient verified:  Yes    Procedure Verified:  Yes    Site Verified:  Yes  Documentation:      Indications for procedure: Monitor chronic disease    Anesthesia: Oxymetazoline nasal spray    Description: Nasal endoscopy with rigid scope was performed with examination of the  septum, inferior, middle, and superior meatus, turbinates, sphenoethmoidal recess, and nasopharynx.     There were no polyps, pus, or granulation tissue noted.  ET orifices and nasopharynx were normal.     Findings: Septal deviation, allergic changes.    The patient tolerated the procedure well.           31231 - NASAL ENDOSCOPY DIAGNOSTIC UNILATERAL OR BILATERAL (AMB ONLY)    Performed by: Dia Sitter, DO  Authorized by: Dia Sitter, DO    Time Out:     Immediately before the procedure, a time out was called:  Yes    Patient verified:  Yes    Procedure Verified:  Yes    Site Verified:  Yes  Documentation:      Procedure: Cerumen cleaning  Pre-op Dx: Cerumen impaction      Bilateral EAC(s) examined under binocular microscopy.  Cerumen and/or debris was cleaned from the canal(s) using curettes, suction, and alligator forceps.  Patient tolerated procedure well.       Dia Sitter, DO

## 2022-02-10 NOTE — H&P (Signed)
[facility], [place]    Progress Note    Name: Miguel Hendricks MRN:  [id number]   Date: 02/10/2022 Age: 71 y.o.          Follow Up      Subjective:   Chief Complaint:   Follow Up 6 Months (6 month rc on ears and allergies. States having some popping in left ear at times.)       History of Present Illness:  Miguel Hendricks is a 71 y.o. old male who presents to the clinic for follow-up. Patient states that allergy symptoms have been well controlled. He denies any changes in hearing or otorrhea.  States that he is hearing some popping int he left ear. Tymps are Type B Ad with increased CV and hypomobile As.    Review of Systems     Physical Exam:     Vitals:    02/10/22 1036   Weight: 111 kg (245 lb)   Height: 1.854 m (6\' 1" )   BMI: 32.39      ENT Physical Exam     Assessment and Plan:       ICD-10-CM    1. ETD (Eustachian tube dysfunction), bilateral  H69.93 POCT HEARING/VISION/TYMPANOGRAM (AMB ONLY)      2. Nasal septal deviation  J34.2       3. Seasonal allergic rhinitis due to pollen  J30.1       4. Tympanic membrane perforation, right  H72.91       5. Chronic rhinitis  J31.0 31231 - NASAL ENDOSCOPY DIAGNOSTIC UNILATERAL OR BILATERAL (AMB ONLY)      6. Bilateral impacted cerumen  H61.23 [id number] - REMOVAL IMPACTED CERUMEN W/ INSTRUMENT, UNILATERAL (AMB ONLY-PD)        Orders Placed This Encounter    81771 - NASAL ENDOSCOPY DIAGNOSTIC UNILATERAL OR BILATERAL (AMB ONLY)    16579 - REMOVAL IMPACTED CERUMEN W/ INSTRUMENT, UNILATERAL (AMB ONLY-PD)    POCT HEARING/VISION/TYMPANOGRAM (AMB ONLY)     Continue Flonase, zyrtec and Singulair.  Cerumen removed  Perforation stable.     Follow up:  Return in about 6 months (around 08/12/2022).    Dia Sitter, DO

## 2022-03-16 ENCOUNTER — Other Ambulatory Visit (INDEPENDENT_AMBULATORY_CARE_PROVIDER_SITE_OTHER): Payer: Self-pay | Admitting: Family Medicine

## 2022-03-20 ENCOUNTER — Encounter (INDEPENDENT_AMBULATORY_CARE_PROVIDER_SITE_OTHER): Payer: Self-pay | Admitting: Family Medicine

## 2022-03-27 ENCOUNTER — Other Ambulatory Visit: Payer: Self-pay

## 2022-03-27 ENCOUNTER — Other Ambulatory Visit (HOSPITAL_COMMUNITY): Payer: Self-pay | Admitting: Family

## 2022-03-27 DIAGNOSIS — K760 Fatty (change of) liver, not elsewhere classified: Secondary | ICD-10-CM

## 2022-04-03 ENCOUNTER — Encounter (INDEPENDENT_AMBULATORY_CARE_PROVIDER_SITE_OTHER): Payer: Self-pay | Admitting: Internal Medicine

## 2022-04-08 ENCOUNTER — Encounter (INDEPENDENT_AMBULATORY_CARE_PROVIDER_SITE_OTHER): Payer: Managed Care, Other (non HMO) | Admitting: Internal Medicine

## 2022-04-13 IMAGING — US ABD LIMITED
1 series · 14 of 25 positions shown · non-contrast
Comparison: MRI dated 05/02/2020 and CT scan dated 04/02/2020.

﻿EXAM: BUSTER PROFESSIONAL READ ABD U/S LMTD
INDICATION: Fatty liver.

[Series 1: abd limited · 14 of 73 slices shown]
[im 1/73]
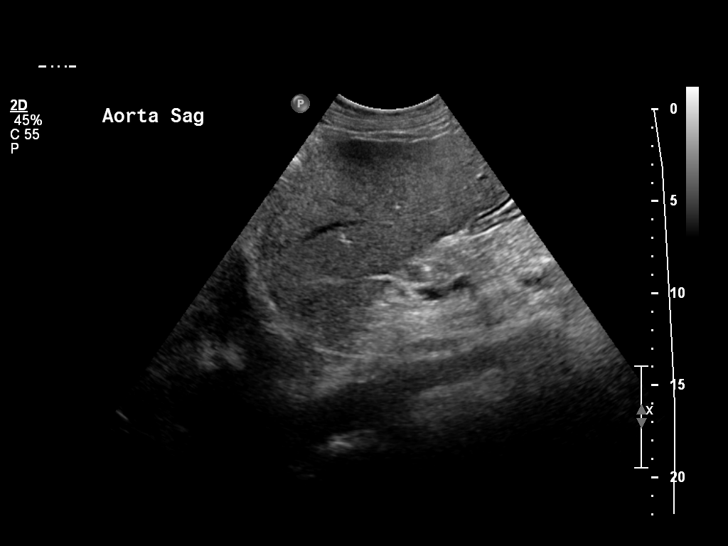
[im 7/73]
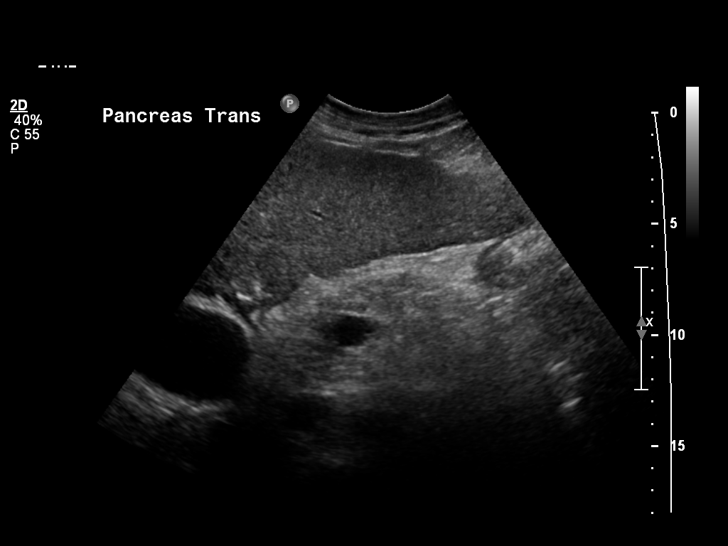
[im 13/73]
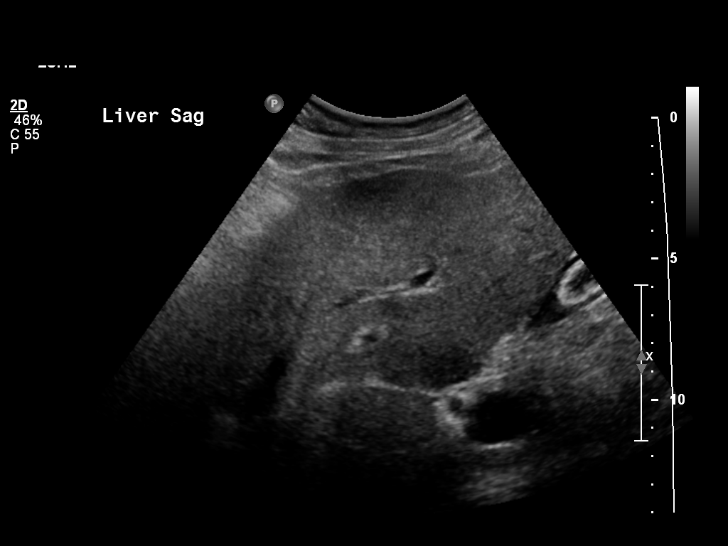
[im 19/73]
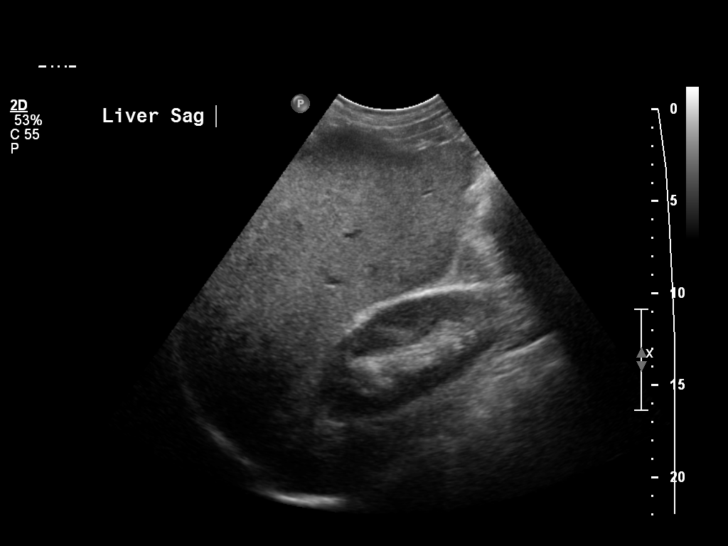
[im 25/73]
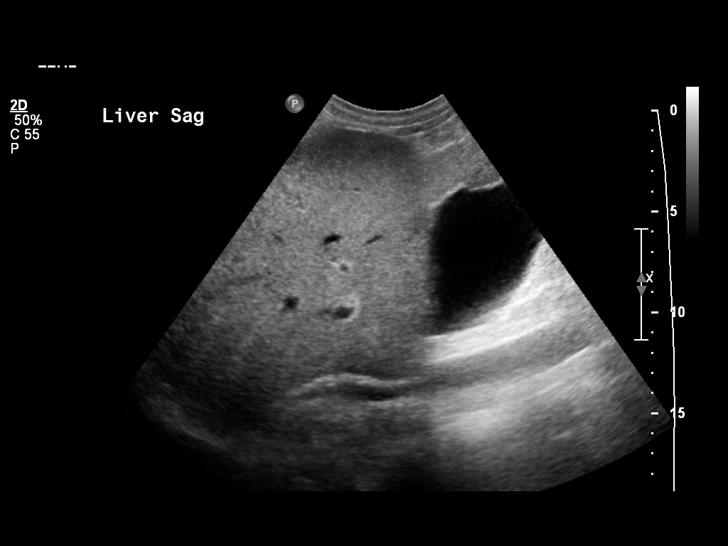
[im 28/73]
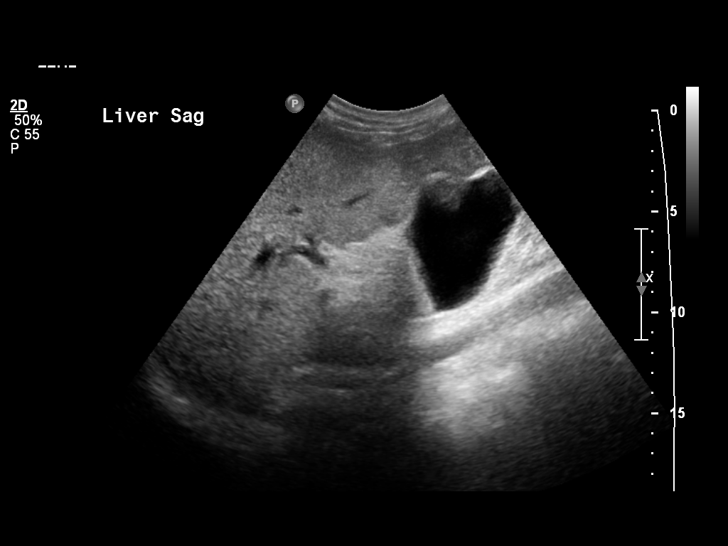
[im 34/73]
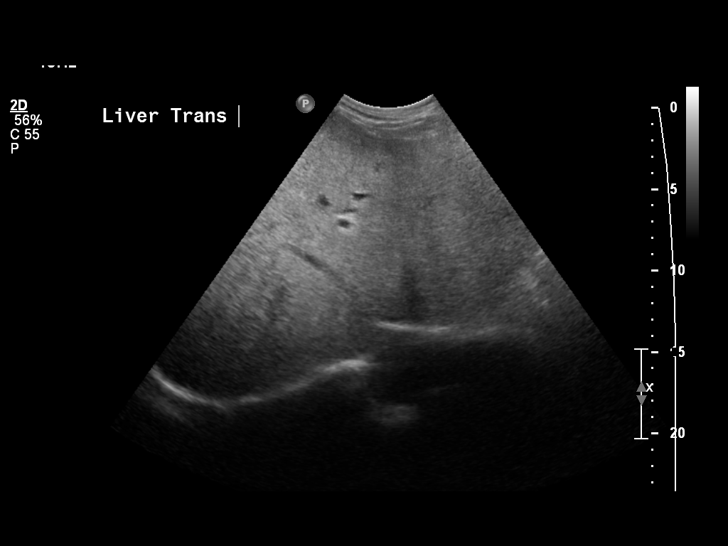
[im 40/73]
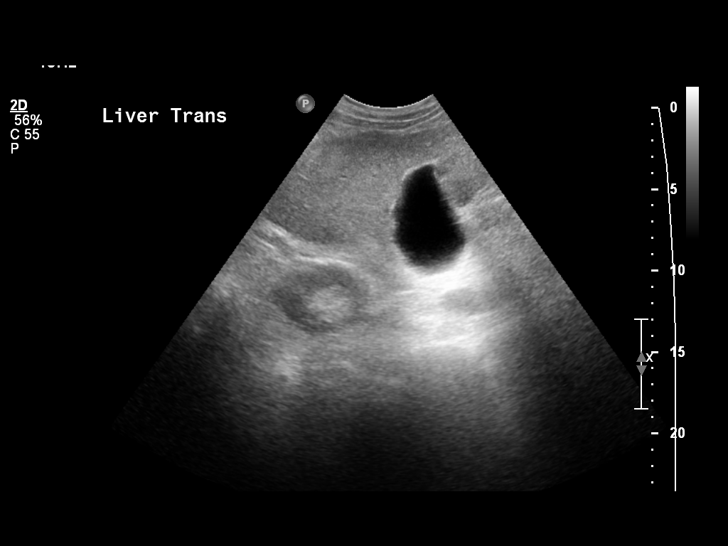
[im 46/73]
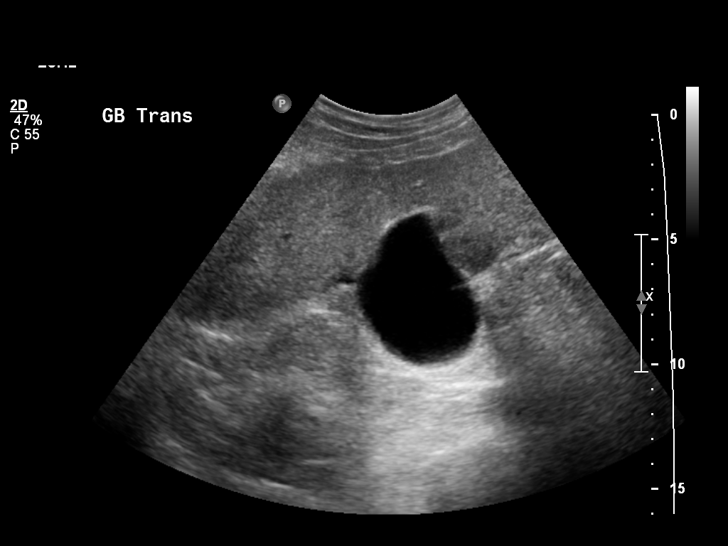
[im 49/73]
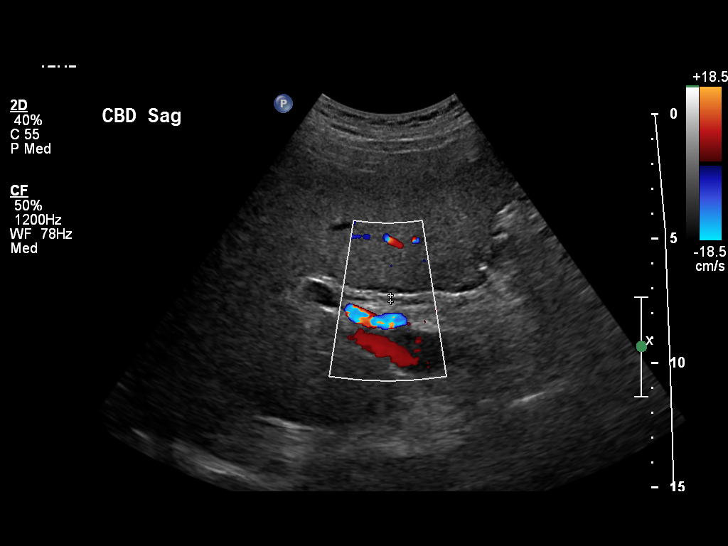
[im 55/73]
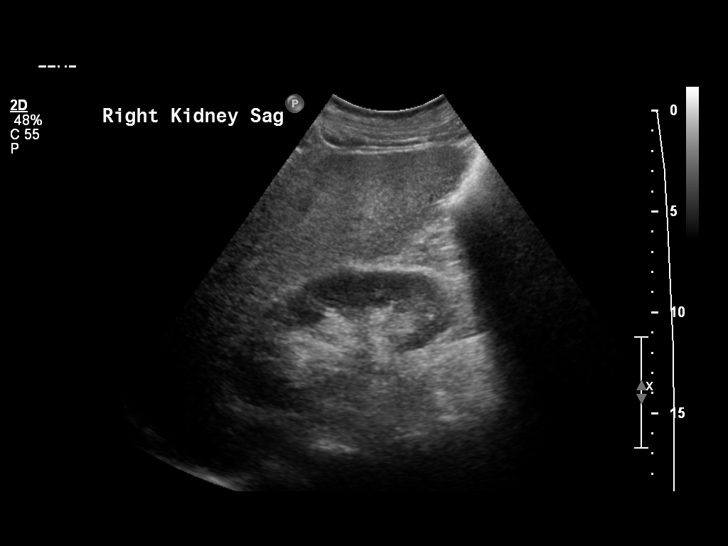
[im 61/73]
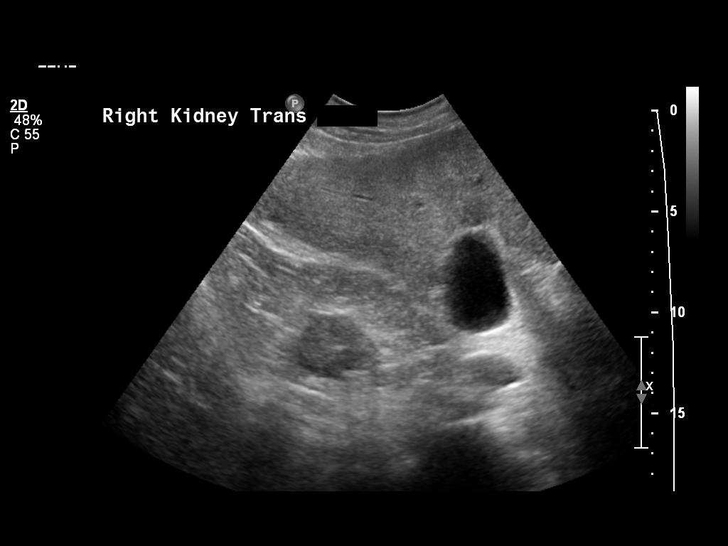
[im 67/73]
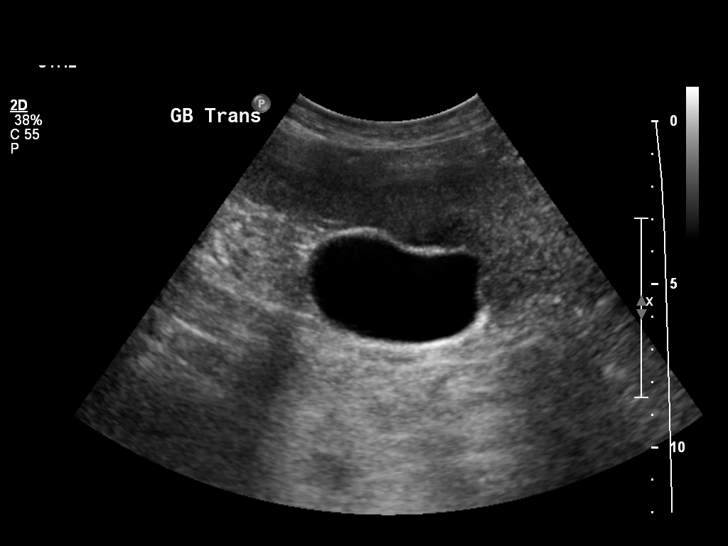
[im 73/73]
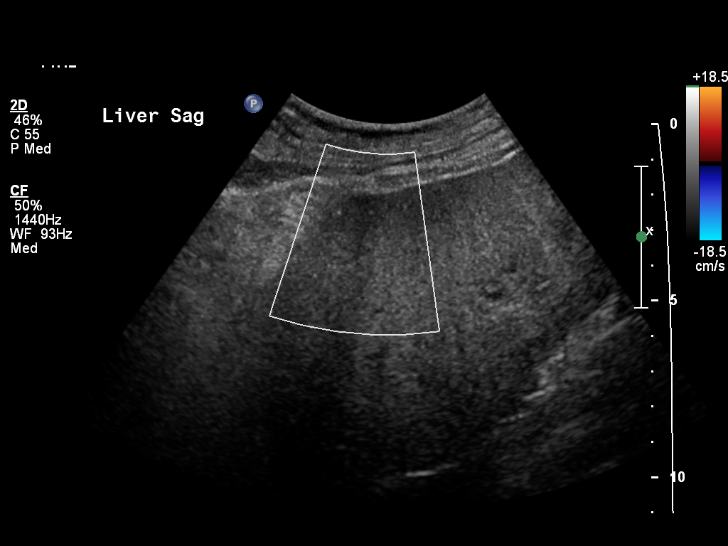

[14 of 25 positions shown; findings below may reference images not displayed]

FINDINGS: Liver is heterogeneous in echogenicity with a nodular contour compatible with cirrhosis. There is no definite hepatic mass. There is no intra or extrahepatic biliary ductal dilation. Common bile duct measures 2.5 mm. No sludge or shadowing gallstones are seen. There is no gallbladder wall thickening or pericholecystic fluid. Pancreas is incompletely visualized due to artifact from overlying bowel gas. Right kidney measures 12.5 cm and is unremarkable. 

Visualized abdominal aorta is without aneurysmal dilatation. IVC is normal. Portal vein measures 11 mm in diameter and demonstrates appropriate hepatopetal direction of flow. Hepatic veins are also patent. There is no ascites.
IMPRESSION: 1. Stable cirrhotic liver.

2. No evidence of cholelithiasis or acute cholecystitis.

3. Pancreas incompletely visualized due to artifact from overlying bowel gas.

## 2022-04-17 ENCOUNTER — Ambulatory Visit (INDEPENDENT_AMBULATORY_CARE_PROVIDER_SITE_OTHER): Payer: Managed Care, Other (non HMO) | Admitting: Internal Medicine

## 2022-04-17 ENCOUNTER — Other Ambulatory Visit: Payer: Self-pay

## 2022-04-17 ENCOUNTER — Encounter (INDEPENDENT_AMBULATORY_CARE_PROVIDER_SITE_OTHER): Payer: Self-pay | Admitting: Internal Medicine

## 2022-04-17 VITALS — BP 118/66 | HR 60 | Temp 97.3°F | Resp 18 | Ht 73.0 in | Wt 256.8 lb

## 2022-04-17 DIAGNOSIS — M05761 Rheumatoid arthritis with rheumatoid factor of right knee without organ or systems involvement: Secondary | ICD-10-CM

## 2022-04-17 DIAGNOSIS — F17200 Nicotine dependence, unspecified, uncomplicated: Secondary | ICD-10-CM

## 2022-04-17 DIAGNOSIS — M05762 Rheumatoid arthritis with rheumatoid factor of left knee without organ or systems involvement: Secondary | ICD-10-CM

## 2022-04-17 DIAGNOSIS — Z86010 Personal history of colonic polyps: Secondary | ICD-10-CM

## 2022-04-17 DIAGNOSIS — I10 Essential (primary) hypertension: Secondary | ICD-10-CM

## 2022-04-17 DIAGNOSIS — Z23 Encounter for immunization: Secondary | ICD-10-CM

## 2022-04-17 DIAGNOSIS — I77810 Thoracic aortic ectasia: Secondary | ICD-10-CM | POA: Insufficient documentation

## 2022-04-17 DIAGNOSIS — K760 Fatty (change of) liver, not elsewhere classified: Secondary | ICD-10-CM

## 2022-04-17 DIAGNOSIS — E785 Hyperlipidemia, unspecified: Secondary | ICD-10-CM

## 2022-04-17 DIAGNOSIS — M05769 Rheumatoid arthritis with rheumatoid factor of unspecified knee without organ or systems involvement: Secondary | ICD-10-CM | POA: Insufficient documentation

## 2022-04-17 DIAGNOSIS — E119 Type 2 diabetes mellitus without complications: Secondary | ICD-10-CM

## 2022-04-17 MED ORDER — TAMSULOSIN 0.4 MG CAPSULE
0.4000 mg | ORAL_CAPSULE | Freq: Every evening | ORAL | 0 refills | Status: DC
Start: 2022-04-17 — End: 2022-10-20

## 2022-04-17 MED ORDER — LOSARTAN 50 MG-HYDROCHLOROTHIAZIDE 12.5 MG TABLET
1.0000 | ORAL_TABLET | Freq: Every day | ORAL | 3 refills | Status: DC
Start: 2022-04-17 — End: 2023-01-20

## 2022-04-17 NOTE — Nursing Note (Signed)
04/17/22 0857   Recent Weight Change   Have you had a recent unexplained weight loss or gain? N   Domestic Violence   Because we are aware of abuse and domestic violence today, we ask all patients: Are you being hurt, hit, or frightened by anyone at your home or in your life?  N   Basic Needs   Do you have any basic needs within your home that are not being met? (such as Food, Shelter, Games developer, Tranportation, paying for bills and/or medications) N   Advanced Directives   Do you have any advanced directives? No Advance   Would you like an advanced directive packet? Accepted Packet

## 2022-04-17 NOTE — Progress Notes (Signed)
FAMILY MEDICINE, MEDICAL OFFICE BUILDING  [address]  [place] [address]      Miguel Hendricks  [birth year]  [id number]    Date of Service: 04/17/2022  9:00 AM EST    Chief complaint:   Chief Complaint   Patient presents with    Follow Up 6 Months       Subjective:     This is a case of a 71 y.o. year old male who comes in today for CDM.    He had a CTA of chest/abd/pelvis 05/23 which showed dilatation of TA at 4.4cm.  No AAA.  He follows w/ Dr. Nathanial Rancher for DM.  Last A1C was 6.5%.  Pt cks his FBS and it is usually generally 115-120. He has not had any low BS.      Pt takes   Metformin 1gm BID  Empaglifozin/linagliptin  Glipizide XL 5mg   ACTOS      CTA chest/abd/pelvis report reviewed.  No labs available     He will have an EGD tomorrow w/ Dr. for concerns about blood vessels that are engorged.  Presumably varices.  He denies any hx of UGI bleeding.  He is aware of dx of fatty liver and takes Pravastatin.  He thinks his next colonoscopy is in a few yr.  He has them every 5 yrs due to hx of colon polyps.     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     ROS:  Constitutional - no appetite or weight changes, no fatigue, no fevers, chills, or night sweats  Respiratory - no dyspnea or cough  Cardiovascular - no chest pain or palpitations  Gastrointestinal - no nausea, vomiting, diarrhea, or constipation, no dyspepsia  Endocrine - no heat or cold intolerance, no polyuria, polydipsia, or polyphagia  Skin - no rashes, color changes, or lesions  Musculoskeletal - joints are doing better w/ Enbrel  Genitourinary - no urinary frequency or dysuria, no genital discharge  Neurologic - no vision or hearing changes, no weakness, no parasthesias   Psychiatric - mood has been appropriate, no depression or anxiety    Active Ambulatory Problems     Diagnosis Date Noted    Abnormal cardiovascular stress test 06/09/2007    Dyslipidemia 08/05/2021    Tobacco use disorder 08/05/2021    Dilatation of thoracic aorta (CMS HCC)  04/17/2022    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    History of colon polyps 04/17/2022     Resolved Ambulatory Problems     Diagnosis Date Noted    No Resolved Ambulatory Problems     Past Medical History:   Diagnosis Date    Allergic rhinitis     Diabetes mellitus (CMS HCC)     DNS (deviated nasal septum)     Esophageal reflux     Essential hypertension     Hx of skin malignancy     Impacted cerumen of both ears        Past Surgical History:   Procedure Laterality Date    HX TONSILLECTOMY  1959    SURGERY SCROTAL / TESTICULAR  2004    [facility]           Family Medical History:    None           Social History     Socioeconomic History    Marital status: Married     Spouse name: Not on file    Number  of children: Not on file    Years of education: Not on file    Highest education level: Not on file   Occupational History    Not on file   Tobacco Use    Smoking status: Former     Packs/day: 1.00     Years: 50.00     Additional pack years: 0.00     Total pack years: 50.00     Types: Cigarettes     Quit date: 2021     Years since quitting: 2.9    Smokeless tobacco: Never   Substance and Sexual Activity    Alcohol use: Not on file    Drug use: Not on file    Sexual activity: Not on file   Other Topics Concern    Not on file   Social History Narrative    Not on file     Social Determinants of Health     Financial Resource Strain: Not on file   Transportation Needs: Not on file   Social Connections: Not on file   Intimate Partner Violence: Not on file   Housing Stability: Not on file       Current Outpatient Medications   Medication Sig    aspirin 81 mg Oral Tablet, Chewable Chew 1 Tablet (81 mg total)    cetirizine (ZYRTEC) 10 mg Oral Tablet TAKE 1 TABLET BY MOUTH EVERY DAY    ENBREL SURECLICK 50 mg/mL (1 mL) Subcutaneous Pen Injector     ergocalciferol, vitamin D2, (DRISDOL) 1,250 mcg (50,000 unit) Oral Capsule Take 1 Capsule (50,000 Units total) by mouth Every 7 days     famotidine (PEPCID) 40 mg Oral Tablet Take 1 Tablet (40 mg total) by mouth Every morning    fluticasone propionate (FLONASE) 50 mcg/actuation Nasal Spray, Suspension Administer 1 Spray into each nostril Once a day    furosemide (LASIX) 40 mg Oral Tablet TAKE 1 TABLET EVERY DAY IN THE MORNING AS NEEDED FOR EDEMA    glipiZIDE (GLUCOTROL XL) 5 mg Oral Tablet Extended Rel 24 hr TAKE 1 (ONE) TABLET BEFORE DINNER    GLYXAMBI 25-5 mg Oral Tablet Take 1 Tablet by mouth Once a day    losartan-hydrochlorothiazide (HYZAAR) 50-12.5 mg Oral Tablet Take 1 Tablet by mouth Once a day    MetFORMIN (GLUCOPHAGE) 1,000 mg Oral Tablet Take 1 Tablet (1,000 mg total) by mouth Twice daily    montelukast (SINGULAIR) 10 mg Oral Tablet Take 1 Tablet (10 mg total) by mouth Once a day    omeprazole (PRILOSEC) 40 mg Oral Capsule, Delayed Release(E.C.) TAKE 1 CAPSULE BY MOUTH EVERY DAY BEFORE DINNER    pioglitazone (ACTOS) 30 mg Oral Tablet Take 1 Tablet (30 mg total) by mouth Once a day    pravastatin (PRAVACHOL) 20 mg Oral Tablet Take 1 Tablet (20 mg total) by mouth    propranoloL (INDERAL) 10 mg Oral Tablet Take 1 Tablet (10 mg total) by mouth Twice daily    tamsulosin (FLOMAX) 0.4 mg Oral Capsule Take 1 Capsule (0.4 mg total) by mouth Every evening after dinner for 90 days       Objective:     BP 118/66   Pulse 60   Temp 36.3 C (97.3 F)   Resp 18   Ht 1.854 m (6\' 1" )   Wt 116 kg (256 lb 12.8 oz)   SpO2 95%   BMI 33.88 kg/m         General appearance: alert, cooperative, in no acute distress  Lungs: clear to auscultation bilaterally; no wheezes or rhonchi   Heart: regular rate and rhythm; normal S1 & S2; no murmur  Abdomen: obese  Extremities: extremities normal ROM, no cyanosis or edema , no rash  Psych: alert and oriented x 3  Neuro: CN 2-12 grossly intact    Assessment/Plan         ICD-10-CM    1. Type 2 diabetes mellitus (CMS HCC)  E11.9       2. Dyslipidemia  E78.5       3. Dilatation of thoracic aorta (CMS HCC)  I77.810        4. 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 Hypertension, unspecified type  I10 losartan-hydrochlorothiazide (HYZAAR) 50-12.5 mg Oral Tablet     tamsulosin (FLOMAX) 0.4 mg Oral Capsule      6. Tobacco use disorder  F17.200       7. Need for Streptococcus pneumoniae vaccination  Z23 Pneumovax (PPSV23)      8. History of colon polyps  Z86.010       9. Fatty liver  K76.0           Orders Placed This Encounter    Pneumovax (PPSV23)    losartan-hydrochlorothiazide (HYZAAR) 50-12.5 mg Oral Tablet    tamsulosin (FLOMAX) 0.4 mg Oral Capsule       Health Maintenance:     Last eye exam:  03/12/22    Colonoscopy with Dr. Allena Katz- obtain report & records     Pt will try to find out why he takes Propranolol.  He will look at bottle at home to see who prescribes it.  It is a very low dose.      The patient was given the opportunity to ask questions and those questions were answered to the patient's satisfaction. The patient was encouraged to call with any additional questions or concerns. Instructed patient to call back if symptoms worse.   Discussed with patient effects and side effects of medications. Medication safety was discussed. A copy of the patient's medication list was printed and given to the patient. A good faith effort was made to reconcile the patient's medications.       Follow up: Return in about 6 months (around 10/17/2022).    Magdalene Patricia, MD

## 2022-04-18 ENCOUNTER — Encounter (HOSPITAL_COMMUNITY)
Admission: RE | Disposition: A | Discharge status: Home / Self Care | Payer: Self-pay | Source: Ambulatory Visit | Attending: Internal Medicine

## 2022-04-18 ENCOUNTER — Ambulatory Visit (HOSPITAL_COMMUNITY): Payer: Managed Care, Other (non HMO) | Admitting: Internal Medicine

## 2022-04-18 ENCOUNTER — Other Ambulatory Visit: Payer: Self-pay

## 2022-04-18 ENCOUNTER — Ambulatory Visit (HOSPITAL_COMMUNITY): Payer: Managed Care, Other (non HMO) | Admitting: Certified Registered"

## 2022-04-18 ENCOUNTER — Ambulatory Visit
Admission: RE | Admit: 2022-04-18 | Discharge: 2022-04-18 | Disposition: A | Discharge status: Home / Self Care | Payer: Managed Care, Other (non HMO) | Source: Ambulatory Visit | Attending: Internal Medicine | Admitting: Internal Medicine

## 2022-04-18 ENCOUNTER — Encounter (INDEPENDENT_AMBULATORY_CARE_PROVIDER_SITE_OTHER): Payer: Self-pay | Admitting: Internal Medicine

## 2022-04-18 DIAGNOSIS — K449 Diaphragmatic hernia without obstruction or gangrene: Secondary | ICD-10-CM | POA: Insufficient documentation

## 2022-04-18 DIAGNOSIS — K2100 Gastro-esophageal reflux disease with esophagitis, without bleeding: Secondary | ICD-10-CM | POA: Insufficient documentation

## 2022-04-18 DIAGNOSIS — K2970 Gastritis, unspecified, without bleeding: Secondary | ICD-10-CM | POA: Insufficient documentation

## 2022-04-18 DIAGNOSIS — I8500 Esophageal varices without bleeding: Secondary | ICD-10-CM | POA: Insufficient documentation

## 2022-04-18 DIAGNOSIS — I8510 Secondary esophageal varices without bleeding: Secondary | ICD-10-CM | POA: Insufficient documentation

## 2022-04-18 LAB — BASIC METABOLIC PANEL
ANION GAP: 5 mmol/L (ref 4–13)
BUN/CREA RATIO: 18 (ref 6–22)
BUN: 13 mg/dL (ref 7–25)
CALCIUM: 9.6 mg/dL (ref 8.6–10.3)
CHLORIDE: 106 mmol/L (ref 98–107)
CO2 TOTAL: 27 mmol/L (ref 21–31)
CREATININE: 0.71 mg/dL (ref 0.60–1.30)
ESTIMATED GFR: 99 mL/min/{1.73_m2} (ref >59)
GLUCOSE: 109 mg/dL (ref 74–109)
OSMOLALITY, CALCULATED: 276 mOsm/kg (ref 270–290)
POTASSIUM: 4.1 mmol/L (ref 3.5–5.1)
SODIUM: 138 mmol/L (ref 136–145)

## 2022-04-18 SURGERY — GASTROSCOPY WITH BANDING
Anesthesia: General | Site: Mouth | Wound class: Clean Contaminated Wounds-The respiratory, GI, Genital, or urinary

## 2022-04-18 MED ORDER — SODIUM CHLORIDE 0.9 % INTRAVENOUS SOLUTION
INTRAVENOUS | Status: DC | PRN
Start: 2022-04-18 — End: 2022-04-18

## 2022-04-18 MED ORDER — LIDOCAINE (PF) 100 MG/5 ML (2 %) INTRAVENOUS SYRINGE
INJECTION | Freq: Once | INTRAVENOUS | Status: DC | PRN
Start: 2022-04-18 — End: 2022-04-18
  Administered 2022-04-18: 100 mg via INTRAVENOUS

## 2022-04-18 MED ORDER — PROPOFOL 10 MG/ML INTRAVENOUS EMULSION
Freq: Once | INTRAVENOUS | Status: DC | PRN
Start: 2022-04-18 — End: 2022-04-18
  Administered 2022-04-18: 20 mL via INTRAVENOUS

## 2022-04-18 SURGICAL SUPPLY — 1 items: LIGATOR 2.8MM 8.6-11.5MM SSS7 ESOPH 1 STNG MLT BAND HNDL STRL DISP ENDOS HMSTS LF (ENDOSCOPIC SUPPLIES) ×1 IMPLANT

## 2022-04-18 NOTE — Anesthesia Preprocedure Evaluation (Signed)
ANESTHESIA PRE-OP EVALUATION  Planned Procedure: EGD POSSIBLE BANDING (Mouth)  Review of Systems     anesthesia history negative               Pulmonary   past history of smoking ,   Cardiovascular    Hypertension and hyperlipidemia ,No peripheral edema,  Exercise Tolerance: > or = 4 METS        GI/Hepatic/Renal    GERD and esophageal disease        Endo/Other    rheumatoid arthritis and obesity,   type 2 diabetes    Neuro/Psych/MS   negative neuro/psych ROS,      Cancer                        Physical Assessment      Airway       Mallampati: III    TM distance: >3 FB    Mouth Opening: good.  Facial hair          Dental           (+) upper dentures, poor dentition           Pulmonary    Breath sounds clear to auscultation  (-) no rhonchi, no decreased breath sounds, no wheezes, no rales and no stridor     Cardiovascular    Rhythm: regular  Rate: Normal  (-) no friction rub, carotid bruit is not present, no peripheral edema and no murmur     Other findings              Plan  ASA 3     Planned anesthesia type: general     total intravenous anesthesia                        Anesthetic plan and risks discussed with patient  signed consent obtained            Patient's NPO status is appropriate for Anesthesia.

## 2022-04-18 NOTE — Anesthesia Postprocedure Evaluation (Signed)
Anesthesia Post Op Evaluation    Patient: Miguel Hendricks  Procedure(s):  EGD with BANDING of one varice    Last Vitals:Temperature: 36.7 C (98.1 F) (04/18/22 1239)  Heart Rate: 80 (04/18/22 1239)  BP (Non-Invasive): 138/89 (04/18/22 1239)  Respiratory Rate: 16 (04/18/22 1239)  SpO2: 96 % (04/18/22 1239)    No notable events documented.    Patient is sufficiently recovered from the effects of anesthesia to participate in the evaluation and has returned to their pre-procedure level.  Patient location during evaluation: PACU       Patient participation: complete - patient participated  Level of consciousness: awake and alert and responsive to verbal stimuli    Pain score: 0  Pain management: adequate  Airway patency: patent    Anesthetic complications: no  Cardiovascular status: acceptable  Respiratory status: acceptable  Hydration status: acceptable  Patient post-procedure temperature: Pt Normothermic   PONV Status: Absent

## 2022-04-18 NOTE — Discharge Instructions (Addendum)
SURGICAL DISCHARGE INSTRUCTIONS     Dr. Rogers Seeds, MD  performed your EGD with BANDING of one varice today at the [facility]    [facility]:  Monday through Friday from 8 a.m. - 4 p.m.: [phone]    For T&D: [phone]  Between 4 p.m. - 8 a.m., weekends and holidays:  Call ER [phone]    PLEASE SEE WRITTEN HANDOUTS AS DISCUSSED BY YOUR NURSE:  esophageal varices    SIGNS AND SYMPTOMS OF A WOUND / INCISION INFECTION   Be sure to watch for the following:  Vomiting bright red or black blood.  Passing maroon or black tarry stools.  Weakness or faintness.    **RETURN TO THE ER IF ONE OR MORE OF THESE SIGNS / SYMPTOMS SHOULD OCCUR.    ANESTHESIA INFORMATION   ANESTHESIA -- ADULT PATIENTS:  You have received intravenous sedation / general anesthesia, and you may feel drowsy and light-headed for several hours. You may even experience some forgetfulness of the procedure. DO NOT DRIVE A MOTOR VEHICLE or perform any activity requiring complete alertness or coordination until you feel fully awake in about 24-48 hours. Do not drink alcoholic beverages for at least 24 hours. Do not stay alone, you must have a responsible adult available to be with you. You may also experience a dry mouth or nausea for 24 hours. This is a normal side effect and will disappear as the effects of the medication wear off.    REMEMBER   If you experience any difficulty breathing, chest pain, bleeding that you feel is excessive, persistent nausea or vomiting or for any other concerns:  Call your physician Dr.  Rogers Seeds, MD   at [phone] . You may also ask to have the general doctor on call paged. They are available to you 24 hours a day.      SPECIAL INSTRUCTIONS / COMMENTS   FINDINGS TODAY:  esophageal varices +2 with one banded     DIET TODAY: LIQUID DIET - SOUPS, OATMEAL, CREAM OF WHEAT OR CREAM OF RICE, PUDDING, ETC.  DIET SATURDAY: VERY SOFT DIET WITH GROUND MOIST MEAT (SPAGHETTI,  MEATLOAF, PASTA, COOKED VEGGIES AND FRUITS, MASHED POTATOES).  DIET FRIDAY: RESUME YOUR REGULAR DIET.    FOLLOW-UP APPOINTMENTS   KEEP FOLLOW UP APPOINTMENT WITH DR. Kirtland Bouchard. [facility].    Dr Rogers Seeds [phone]

## 2022-04-18 NOTE — OR Surgeon (Signed)
[facility]      EGD NOTE               Patient Name: Miguel Hendricks  Age:  71 y.o.  Sex:  male  MRN:  [id number]  CSN:  [id number]  Date of Birth: [birth year]    Medications Given: Per Nurse Anesthetist    Equipment Used: Olympus [id number]    Date: 04/18/22     Indications:   GERD, history of cirrhosis, history of esophageal varices,    The scope was passed into the esophagus without difficulty under direct visualization. There was mild erythema at the distal esophagus. +1 to +2 esophageal varices were seen without signs of bleeding. A small hiatal hernia was seen. The mucosa in the cardia appeared normal. The diaphragmatic impingement on retroflexion view appeared normal. The Z line appeared slightly irregular.      The mucosa in the fundus was normal. The mucosa in the body and antrum had moderate erythema. The mucosa in the duodenal bulb was normal. The mucosa past the duodenal bulb to the 2nd portion of the duodenum appeared normal. The ampulla was not visualized.     The [employer] speed band equipment was applied and 1 varix was successfully banded in the distal esophagus with good hemostasis. Biopsies were not taken. Photograph(s) Taken: Yes; Blood loss: trace;     Impression:  Mild distal esophagitis, grade 1  +1 to +2 esophageal varices (moderate size), 1 varix banded as described above  Small hiatal hernia  Moderate gastritis     Recommendations:  Continue Omeprazole 40 mg po q day, before dinner  Continue Famotidine 40 mg po bid, before breakfast and q hs  Continue Propranolol 10 mg po bid for portal hypertension  Maintain antireflux precautions including head of bed elevation.  Repeat EGD in 1 year to reassess the esophageal varices     Rogers Seeds, MD

## 2022-06-09 ENCOUNTER — Other Ambulatory Visit: Payer: Self-pay

## 2022-06-09 ENCOUNTER — Ambulatory Visit
Admission: RE | Admit: 2022-06-09 | Discharge: 2022-06-09 | Disposition: A | Discharge status: Home / Self Care | Payer: Managed Care, Other (non HMO) | Source: Ambulatory Visit | Attending: Family | Admitting: Family

## 2022-06-09 DIAGNOSIS — K760 Fatty (change of) liver, not elsewhere classified: Secondary | ICD-10-CM | POA: Insufficient documentation

## 2022-07-17 ENCOUNTER — Other Ambulatory Visit (HOSPITAL_COMMUNITY): Payer: Self-pay | Admitting: Family

## 2022-07-17 DIAGNOSIS — R935 Abnormal findings on diagnostic imaging of other abdominal regions, including retroperitoneum: Secondary | ICD-10-CM

## 2022-07-28 ENCOUNTER — Other Ambulatory Visit: Payer: Self-pay

## 2022-07-28 ENCOUNTER — Ambulatory Visit
Admission: RE | Admit: 2022-07-28 | Discharge: 2022-07-28 | Disposition: A | Discharge status: Home / Self Care | Payer: Managed Care, Other (non HMO) | Source: Ambulatory Visit | Attending: Family | Admitting: Family

## 2022-07-28 DIAGNOSIS — R935 Abnormal findings on diagnostic imaging of other abdominal regions, including retroperitoneum: Secondary | ICD-10-CM | POA: Insufficient documentation

## 2022-07-28 MED ORDER — GADOBUTROL 10 MMOL/10 ML (1 MMOL/ML) INTRAVENOUS SOLUTION
10.0000 mL | INTRAVENOUS | Status: AC
Start: 2022-07-28 — End: 2022-07-28
  Administered 2022-07-28: 10 mL via INTRAVENOUS

## 2022-08-11 ENCOUNTER — Ambulatory Visit (RURAL_HEALTH_CENTER): Payer: Self-pay | Admitting: Family

## 2022-08-16 ENCOUNTER — Other Ambulatory Visit (INDEPENDENT_AMBULATORY_CARE_PROVIDER_SITE_OTHER): Payer: Self-pay | Admitting: OTOLARYNGOLOGY

## 2022-08-21 ENCOUNTER — Ambulatory Visit (INDEPENDENT_AMBULATORY_CARE_PROVIDER_SITE_OTHER): Payer: Managed Care, Other (non HMO) | Admitting: OTOLARYNGOLOGY

## 2022-08-21 ENCOUNTER — Other Ambulatory Visit: Payer: Self-pay

## 2022-08-21 ENCOUNTER — Encounter (INDEPENDENT_AMBULATORY_CARE_PROVIDER_SITE_OTHER): Payer: Self-pay | Admitting: OTOLARYNGOLOGY

## 2022-08-21 VITALS — Ht 73.0 in | Wt 255.0 lb

## 2022-08-21 DIAGNOSIS — H7291 Unspecified perforation of tympanic membrane, right ear: Secondary | ICD-10-CM

## 2022-08-21 DIAGNOSIS — H6123 Impacted cerumen, bilateral: Secondary | ICD-10-CM

## 2022-08-21 DIAGNOSIS — H6993 Unspecified Eustachian tube disorder, bilateral: Secondary | ICD-10-CM

## 2022-08-21 DIAGNOSIS — J342 Deviated nasal septum: Secondary | ICD-10-CM

## 2022-08-21 DIAGNOSIS — J310 Chronic rhinitis: Secondary | ICD-10-CM

## 2022-08-21 MED ORDER — CETIRIZINE 10 MG TABLET
10.0000 mg | ORAL_TABLET | Freq: Every day | ORAL | 3 refills | Status: DC
Start: 2022-08-21 — End: 2023-02-17

## 2022-08-21 MED ORDER — MONTELUKAST 10 MG TABLET
10.0000 mg | ORAL_TABLET | Freq: Every day | ORAL | 3 refills | Status: DC
Start: 2022-08-21 — End: 2023-02-17

## 2022-08-21 NOTE — Procedures (Signed)
ENT, [facility]  [address]  [place] [address]    Procedure Note    Name: Miguel Hendricks MRN:  [id number]   Date: 08/21/2022 DOB:  [birth year] (71 y.o.)         31231 - NASAL ENDOSCOPY DIAGNOSTIC UNILATERAL OR BILATERAL (AMB ONLY)    Performed by: Conchita Paris, DO  Authorized by: Conchita Paris, DO    Time Out:     Immediately before the procedure, a time out was called:  Yes    Patient verified:  Yes    Procedure Verified:  Yes    Site Verified:  Yes  Documentation:      Indications for procedure: Monitor chronic disease    Anesthesia: Oxymetazoline nasal spray    Description: Nasal endoscopy with rigid scope was performed with examination of the  septum, inferior, middle, and superior meatus, turbinates, sphenoethmoidal recess, and nasopharynx.     There were no polyps, pus, or granulation tissue noted.  ET orifices and nasopharynx were normal.     Findings: Septal deviation, allergic changes    The patient tolerated the procedure well.           58309 - REMOVAL IMPACTED CERUMEN W/ INSTRUMENT, UNILATERAL (AMB ONLY-PD)    Performed by: Conchita Paris, DO  Authorized by: Conchita Paris, DO    Time Out:     Immediately before the procedure, a time out was called:  Yes    Patient verified:  Yes    Procedure Verified:  Yes    Site Verified:  Yes  Documentation:      Procedure: Cerumen cleaning  Pre-op Dx: Cerumen impaction      Bilateral EAC(s) examined under binocular microscopy.  Cerumen and/or debris was cleaned from the canal(s) using curettes, suction, and alligator forceps.  Patient tolerated procedure well.        Conchita Paris, DO

## 2022-08-21 NOTE — H&P (Signed)
[facility]  [facility], [facility]    Progress Note    Name: Miguel Hendricks MRN:  [id number]   Date: 08/21/2022 Age: 72 y.o.          Follow Up      Subjective:   Chief Complaint:   Follow Up 6 Months (6 month rc on allergies and ears. States having wax in both ears.)       History of Present Illness:  Miguel Hendricks is a 72 y.o. old male who presents to the clinic for follow-up. Patient states that allergy symptoms have been well controlled. He denies any changes in hearing or otorrhea.  States that he is hearing some popping int he left ear. Tymps are Type B Ad with increased CV and hypomobile As.    Review of Systems     Physical Exam:     Vitals:    08/21/22 1001   Weight: 116 kg (255 lb)   Height: 1.854 m (6\' 1" )   BMI: 33.71      ENT Physical Exam  Constitutional  Appearance: patient appears well-developed, well-nourished and well-groomed,  Communication/Voice: communication appropriate for developmental age; vocal quality normal;  Head and Face  Appearance: head appears normal, face appears normal and face appears atraumatic;  Palpation: facial palpation normal;  Salivary: glands normal;  Ear  Hearing: intact;  Auricles: right auricle normal; left auricle normal;  External Mastoids: right external mastoid normal; left external mastoid normal;  Ear Canals: bilateral ear canals impacted cerumen observed;  Tympanic Membranes: left tympanic membrane normal;  Ear comments: 40% perforation dry  Nose  External Nose: nares patent bilaterally; external nose normal;  Internal Nose: nasal mucosa normal; septum normal; bilateral inferior turbinates normal;  Oral Cavity/Oropharynx  Lips: normal;  Teeth: normal;  Gums: gingiva normal;  Tongue: normal;  Oral mucosa: normal;  Hard palate: normal;  Soft palate: normal;  Tonsils: normal;  Base of Tongue: normal;  Posterior pharyngeal wall: normal;  Neck  Neck: neck normal; neck palpation normal;  Thyroid: thyroid normal;  Respiratory  Inspection: breathing unlabored; normal  breathing rate;  Lymphatic  Palpation: lymph nodes normal;  Neurovestibular  Mental Status: alert and oriented;  Psychiatric: mood normal; affect is appropriate;  Cranial Nerves: cranial nerves intact;       Assessment and Plan:       ICD-10-CM    1. ETD (Eustachian tube dysfunction), bilateral  H69.93 31231 - NASAL ENDOSCOPY DIAGNOSTIC UNILATERAL OR BILATERAL (AMB ONLY)      2. Nasal septal deviation  J34.2       3. Bilateral impacted cerumen  H61.23 [id number] - REMOVAL IMPACTED CERUMEN W/ INSTRUMENT, UNILATERAL (AMB ONLY-PD)      4. Chronic rhinitis  J31.0       5. Tympanic membrane perforation, right  H72.91         Orders Placed This Encounter    31231 - NASAL ENDOSCOPY DIAGNOSTIC UNILATERAL OR BILATERAL (AMB ONLY)    73710 - REMOVAL IMPACTED CERUMEN W/ INSTRUMENT, UNILATERAL (AMB ONLY-PD)    cetirizine (ZYRTEC) 10 mg Oral Tablet    montelukast (SINGULAIR) 10 mg Oral Tablet     Continue Flonase, zyrtec and Singulair.  Cerumen removed  Perforation stable.     Follow up:  Return in about 6 months (around 02/20/2023).    Miguel Paris, DO

## 2022-10-17 ENCOUNTER — Encounter (INDEPENDENT_AMBULATORY_CARE_PROVIDER_SITE_OTHER): Payer: Self-pay | Admitting: Internal Medicine

## 2022-10-20 ENCOUNTER — Ambulatory Visit (INDEPENDENT_AMBULATORY_CARE_PROVIDER_SITE_OTHER): Payer: Managed Care, Other (non HMO) | Admitting: Internal Medicine

## 2022-10-20 ENCOUNTER — Encounter (INDEPENDENT_AMBULATORY_CARE_PROVIDER_SITE_OTHER): Payer: Self-pay | Admitting: Internal Medicine

## 2022-10-20 ENCOUNTER — Other Ambulatory Visit: Payer: Self-pay

## 2022-10-20 VITALS — BP 122/73 | HR 64 | Temp 97.8°F | Ht 73.0 in | Wt 270.2 lb

## 2022-10-20 DIAGNOSIS — Z1159 Encounter for screening for other viral diseases: Secondary | ICD-10-CM

## 2022-10-20 DIAGNOSIS — Z7985 Long-term (current) use of injectable non-insulin antidiabetic drugs: Secondary | ICD-10-CM

## 2022-10-20 DIAGNOSIS — E785 Hyperlipidemia, unspecified: Secondary | ICD-10-CM

## 2022-10-20 DIAGNOSIS — I8500 Esophageal varices without bleeding: Secondary | ICD-10-CM

## 2022-10-20 DIAGNOSIS — E119 Type 2 diabetes mellitus without complications: Secondary | ICD-10-CM

## 2022-10-20 DIAGNOSIS — Z7984 Long term (current) use of oral hypoglycemic drugs: Secondary | ICD-10-CM

## 2022-10-20 DIAGNOSIS — F17200 Nicotine dependence, unspecified, uncomplicated: Secondary | ICD-10-CM

## 2022-10-20 DIAGNOSIS — I10 Essential (primary) hypertension: Secondary | ICD-10-CM

## 2022-10-20 DIAGNOSIS — M545 Low back pain, unspecified: Secondary | ICD-10-CM

## 2022-10-20 DIAGNOSIS — Z125 Encounter for screening for malignant neoplasm of prostate: Secondary | ICD-10-CM

## 2022-10-20 MED ORDER — TAMSULOSIN 0.4 MG CAPSULE
0.4000 mg | ORAL_CAPSULE | Freq: Every evening | ORAL | 3 refills | Status: DC
Start: 2022-10-20 — End: 2023-07-20

## 2022-10-20 MED ORDER — CYCLOBENZAPRINE 10 MG TABLET
10.0000 mg | ORAL_TABLET | Freq: Three times a day (TID) | ORAL | 3 refills | Status: DC
Start: 2022-10-20 — End: 2023-03-02

## 2022-10-20 NOTE — Progress Notes (Signed)
[facility]ILY [facility], [facility]  [address]  [place] [address]      Miguel Hendricks  1950/11/17  [id number]    Date of Service: 10/20/2022  9:00 AM EDT    Chief complaint:   Chief Complaint   Patient presents with    Follow Up 6 Months       Subjective:     This is a case of a 72 y.o. year old male who comes in today for cdm.    He has seen Dr. Nathanial Rancher.  He has started Ozempic 0.5mg  for about a month and has started losing weight.  He started Jardiance 25 mg daily.  His FBS is 120-140's.     Pt will have a colonoscopy next mont w/ Dr. Allena Katz.     He has occ. Low back pain that he notes in the AM.     Pt sees podiatrist every 3 months.     Recent lab results reviewed and noted:    A1C 6.7% about 3 months ago.   BASIC METABOLIC PANEL  Lab Results   Component Value Date    SODIUM 138 04/18/2022    POTASSIUM 4.1 04/18/2022    CHLORIDE 106 04/18/2022    CO2 27 04/18/2022    ANIONGAP 5 04/18/2022    BUN 13 04/18/2022    CREATININE 0.71 04/18/2022    BUNCRRATIO 18 04/18/2022    GFR 99 04/18/2022    CALCIUM 9.6 04/18/2022    GLUCOSENF 109 04/18/2022      LIVER TESTS  No results found for: "ALBUMIN", "AST", "ALT", "ALKPHOS", "TOTBILIRUBIN", "BILIRUBINCON", "GAMMAGT", "INR"  VITAMIN B12  No results found for: "VITB12"  VITAMIN D 25-HYDROXY  No results found for: "VITD25", "VITD"    ROS:  Constitutional - no appetite or weight changes, no fatigue, no fevers, chills, or night sweats  Respiratory - no dyspnea or cough  Cardiovascular - no chest pain or palpitations  Gastrointestinal - no nausea, vomiting, diarrhea, or constipation, no dyspepsia  Endocrine - no heat or cold intolerance, no polyuria, polydipsia, or polyphagia  Skin - no rashes, color changes, or lesions  Musculoskeletal - no arthralgias or myalgias  Genitourinary - no urinary frequency or dysuria, no genital discharge  Neurologic - no vision or hearing changes, no weakness, no parasthesias   Psychiatric - mood has been appropriate, no depression  or anxiety    Active Ambulatory Problems     Diagnosis Date Noted    Abnormal cardiovascular stress test 06/09/2007    Dyslipidemia 08/05/2021    Tobacco use disorder 08/05/2021    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    History of colon polyps 04/17/2022    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) 04/18/2022    Gastritis 04/18/2022    Hiatal hernia 04/18/2022     Resolved Ambulatory Problems     Diagnosis Date Noted    No Resolved Ambulatory Problems     Past Medical History:   Diagnosis Date    Allergic rhinitis     Diabetes mellitus (CMS HCC)     DNS (deviated nasal septum)     Esophageal reflux     Essential hypertension     Hx of skin malignancy     Impacted cerumen of both ears        Past Surgical History:   Procedure Laterality Date    HX TONSILLECTOMY  1959  SURGERY SCROTAL / TESTICULAR  2004    [facility]           Current Outpatient Medications   Medication Sig    aspirin 81 mg Oral Tablet, Chewable Chew 1 Tablet (81 mg total)    cetirizine (ZYRTEC) 10 mg Oral Tablet Take 1 Tablet (10 mg total) by mouth Once a day    cyclobenzaprine (FLEXERIL) 10 mg Oral Tablet Take 1 Tablet (10 mg total) by mouth Three times a day Indications: muscle spasm    ENBREL SURECLICK 50 mg/mL (1 mL) Subcutaneous Pen Injector     ergocalciferol, vitamin D2, (DRISDOL) 1,250 mcg (50,000 unit) Oral Capsule Take 1 Capsule (50,000 Units total) by mouth Every 7 days    famotidine (PEPCID) 40 mg Oral Tablet Take 1 Tablet (40 mg total) by mouth Every morning    fluticasone propionate (FLONASE) 50 mcg/actuation Nasal Spray, Suspension Administer 1 Spray into each nostril Once a day    furosemide (LASIX) 40 mg Oral Tablet TAKE 1 TABLET EVERY DAY IN THE MORNING AS NEEDED FOR EDEMA    glipiZIDE (GLUCOTROL XL) 5 mg Oral Tablet Extended Rel 24 hr TAKE 1 (ONE) TABLET BEFORE DINNER    losartan-hydrochlorothiazide (HYZAAR) 50-12.5 mg  Oral Tablet Take 1 Tablet by mouth Once a day    MetFORMIN (GLUCOPHAGE) 1,000 mg Oral Tablet Take 1 Tablet (1,000 mg total) by mouth Twice daily    montelukast (SINGULAIR) 10 mg Oral Tablet Take 1 Tablet (10 mg total) by mouth Once a day    omeprazole (PRILOSEC) 40 mg Oral Capsule, Delayed Release(E.C.) TAKE 1 CAPSULE BY MOUTH EVERY DAY BEFORE DINNER    pioglitazone (ACTOS) 30 mg Oral Tablet Take 1 Tablet (30 mg total) by mouth Once a day    pravastatin (PRAVACHOL) 20 mg Oral Tablet Take 1 Tablet (20 mg total) by mouth    propranoloL (INDERAL) 10 mg Oral Tablet Take 1 Tablet (10 mg total) by mouth Twice daily    tamsulosin (FLOMAX) 0.4 mg Oral Capsule Take 1 Capsule (0.4 mg total) by mouth Every evening after dinner Indications: enlarged prostate with urination problem       Objective:     BP 122/73   Pulse 64   Temp 36.6 C (97.8 F) (Temporal)   Ht 1.854 m (6\' 1" )   Wt 123 kg (270 lb 3.2 oz)   SpO2 91%   BMI 35.65 kg/m       BMI addressed: Advised on diet, weight loss, and exercise to reduce above normal BMI.     General appearance: alert, cooperative, in no acute distress  Lungs: clear to auscultation bilaterally; no wheezes or rhonchi   Heart: regular rate and rhythm; normal S1 & S2; no murmur  Extremities: extremities normal ROM, no cyanosis or edema , no rash  No plantar lesions reported.  Some neuropathy noted by pt.   Psych: alert and oriented x 3  Neuro: CN 2-12 grossly intact      Assessment/Plan         ICD-10-CM    1. Tobacco use disorder  F17.200 CT LUNG SCREENING LDCT      2. Type 2 diabetes mellitus (CMS HCC)  E11.9 COMPREHENSIVE METABOLIC PNL, FASTING     CANCELED: COMPREHENSIVE METABOLIC PNL, FASTING      3. 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)  I85.00       4. Hypertension, unspecified type  I10 tamsulosin (FLOMAX) 0.4 mg Oral Capsule      5. Dyslipidemia  E78.5 LIPID PANEL      6. Low back pain  M54.50 cyclobenzaprine (FLEXERIL) 10 mg Oral Tablet     CBC/DIFF     CANCELED:  CBC/DIFF      7. Screening for prostate cancer  Z12.5 PSA SCREENING      8. Encounter for hepatitis C screening test for low risk patient  Z11.59 HEPATITIS C ANTIBODY SCREEN WITH REFLEX TO HCV PCR     CANCELED: HEPATITIS C ANTIBODY SCREEN WITH REFLEX TO HCV PCR          Orders Placed This Encounter    CT LUNG SCREENING LDCT    PSA SCREENING    CANCELED: HEPATITIS C ANTIBODY SCREEN WITH REFLEX TO HCV PCR    CANCELED: COMPREHENSIVE METABOLIC PNL, FASTING    LIPID PANEL    CANCELED: CBC/DIFF    CBC/DIFF    COMPREHENSIVE METABOLIC PNL, FASTING    HEPATITIS C ANTIBODY SCREEN WITH REFLEX TO HCV PCR    cyclobenzaprine (FLEXERIL) 10 mg Oral Tablet    tamsulosin (FLOMAX) 0.4 mg Oral Capsule       Health Maintenance:   Depression screening is negative. PHQ 2 Total: 0     Colon next month    He will  have labs next visit.  Chose not to have today.     Shingles vaccine recommended.       The patient was given the opportunity to ask questions and those questions were answered to the patient's satisfaction. The patient was encouraged to call with any additional questions or concerns. Instructed patient to call back if symptoms worse.   Discussed with patient effects and side effects of medications. Medication safety was discussed. A copy of the patient's medication list was printed and given to the patient. A good faith effort was made to reconcile the patient's medications.       Follow up: Return in about 3 months (around 01/20/2023).    Magdalene Patricia, MD

## 2022-10-20 NOTE — Nursing Note (Signed)
10/20/22 0852   Recent Weight Change   Have you had a recent unexplained weight loss or gain? N   Domestic Violence   Because we are aware of abuse and domestic violence today, we ask all patients: Are you being hurt, hit, or frightened by anyone at your home or in your life?  N   Basic Needs   Do you have any basic needs within your home that are not being met? (such as Food, Shelter, [employer], Tranportation, paying for bills and/or medications) N

## 2022-10-20 NOTE — Nursing Note (Signed)
10/20/22 1610   Fall Risk Assessment   Do you feel unsteady when standing or walking? No   Do you worry about falling? No   Have you fallen in the past year? No

## 2022-10-20 NOTE — Nursing Note (Signed)
10/20/22 0852   Depression Screen   Little interest or pleasure in doing things. 0   Feeling down, depressed, or hopeless 0   PHQ 2 Total 0

## 2022-12-24 ENCOUNTER — Ambulatory Visit (HOSPITAL_COMMUNITY): Payer: Managed Care, Other (non HMO) | Admitting: Orthopaedic Surgery

## 2022-12-24 ENCOUNTER — Other Ambulatory Visit: Payer: Self-pay

## 2022-12-24 ENCOUNTER — Ambulatory Visit: Payer: Managed Care, Other (non HMO) | Attending: Orthopaedic Surgery

## 2022-12-24 DIAGNOSIS — M25461 Effusion, right knee: Secondary | ICD-10-CM | POA: Insufficient documentation

## 2022-12-24 LAB — SYNOVIAL (JOINT) FLUID CRYSTALS

## 2022-12-24 LAB — SYNOVIAL FLUID: NUCLEATED CELLS, FLUID: 12327 /uL

## 2022-12-24 LAB — SYNOVIAL FLUID DIFFERENTIAL
NEUTROPHIL %: 22 %
OTHER CELL %: 78 %

## 2022-12-29 LAB — FLUID CULTURE & GRAM STAIN
FLC: NO GROWTH
GRAM STAIN: NONE SEEN

## 2023-01-20 ENCOUNTER — Encounter (INDEPENDENT_AMBULATORY_CARE_PROVIDER_SITE_OTHER): Payer: Self-pay | Admitting: Internal Medicine

## 2023-01-20 ENCOUNTER — Ambulatory Visit (INDEPENDENT_AMBULATORY_CARE_PROVIDER_SITE_OTHER): Payer: Managed Care, Other (non HMO) | Admitting: Internal Medicine

## 2023-01-20 ENCOUNTER — Other Ambulatory Visit: Payer: Self-pay

## 2023-01-20 VITALS — BP 128/79 | HR 89 | Temp 98.1°F | Resp 18 | Ht 73.0 in | Wt 270.0 lb

## 2023-01-20 DIAGNOSIS — M05769 Rheumatoid arthritis with rheumatoid factor of unspecified knee without organ or systems involvement: Secondary | ICD-10-CM

## 2023-01-20 DIAGNOSIS — F17200 Nicotine dependence, unspecified, uncomplicated: Secondary | ICD-10-CM

## 2023-01-20 DIAGNOSIS — E785 Hyperlipidemia, unspecified: Secondary | ICD-10-CM

## 2023-01-20 DIAGNOSIS — E119 Type 2 diabetes mellitus without complications: Secondary | ICD-10-CM

## 2023-01-20 DIAGNOSIS — I8500 Esophageal varices without bleeding: Secondary | ICD-10-CM

## 2023-01-20 DIAGNOSIS — Z87891 Personal history of nicotine dependence: Secondary | ICD-10-CM

## 2023-01-20 DIAGNOSIS — Z86010 Personal history of colonic polyps: Secondary | ICD-10-CM

## 2023-01-20 DIAGNOSIS — Z7984 Long term (current) use of oral hypoglycemic drugs: Secondary | ICD-10-CM

## 2023-01-20 DIAGNOSIS — I10 Essential (primary) hypertension: Secondary | ICD-10-CM

## 2023-01-20 DIAGNOSIS — I77810 Thoracic aortic ectasia: Secondary | ICD-10-CM

## 2023-01-20 DIAGNOSIS — Z7985 Long-term (current) use of injectable non-insulin antidiabetic drugs: Secondary | ICD-10-CM

## 2023-01-20 MED ORDER — LOSARTAN 50 MG-HYDROCHLOROTHIAZIDE 12.5 MG TABLET
1.0000 | ORAL_TABLET | Freq: Every day | ORAL | 3 refills | Status: DC
Start: 2023-01-20 — End: 2024-03-04

## 2023-01-20 NOTE — Assessment & Plan Note (Signed)
Quit 2021

## 2023-01-20 NOTE — Assessment & Plan Note (Addendum)
Enbrel

## 2023-01-20 NOTE — Assessment & Plan Note (Addendum)
Pravastatin  

## 2023-01-20 NOTE — Progress Notes (Signed)
FAMILY MEDICINE, MEDICAL OFFICE BUILDING  [address]  [place] [address]      Miguel Hendricks  [birth year]  [id number]    Date of Service: 01/20/2023  9:15 AM EDT    Chief complaint:   Chief Complaint   Patient presents with    Follow Up 3 Months       Subjective:     This is a case of a 72 y.o. year old male who comes in today for CDM.    He had fluid drawn from his left knee 2 weeks ago.  No crystals.      He takes Ibuprofen very occ. For his arthritis sxs.     He saw Dr. Bridget Hartshorn and eye doctor, Bufford Spikes, about 2 weeks ago.    His last A1C was 5.8% but he does not have labs from most recent.     He had colon about a month ago with Allena Katz.  No polyps.  Next one in 5 yrs.     Pt has some pain in his hands.  His knees are better with Enbrel. He follows with Willis Modena.    Recent lab results reviewed and noted- requested    PER EGD 12/23  1. Mild distal esophagitis, grade 1  2. +1 to +2 esophageal varices (moderate size), 1 varix banded as described above  3. Small hiatal hernia  4. Moderate gastritis     Recommendations:  1. Continue Omeprazole 40 mg po q day, before dinner  2. Continue Famotidine 40 mg po bid, before breakfast and q hs  3. Continue Propranolol 10 mg po bid for portal hypertension  4. Maintain antireflux precautions including head of bed elevation.  5. Repeat EGD in 1 year to reassess the esophageal varices  ROS:  Constitutional - no appetite or weight changes, no fatigue, no fevers, chills, or night sweats  Respiratory - no dyspnea or cough  Cardiovascular - no chest pain or palpitations  Gastrointestinal - no nausea, vomiting, diarrhea, or constipation, no dyspepsia  Endocrine - no heat or cold intolerance, no polyuria, polydipsia, or polyphagia  Skin - no rashes, color changes, or lesions  Musculoskeletal - no arthralgias or myalgias  Genitourinary - no urinary frequency or dysuria, no genital discharge  Neurologic - no vision or hearing changes, no weakness, no parasthesias   Psychiatric -  mood has been appropriate, no depression or anxiety    Active Ambulatory Problems     Diagnosis Date Noted    Abnormal cardiovascular stress test 06/09/2007    Dyslipidemia 08/05/2021    Tobacco use disorder 08/05/2021    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    History of colon polyps 04/17/2022    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) 04/18/2022    Gastritis 04/18/2022    Hiatal hernia 04/18/2022     Resolved Ambulatory Problems     Diagnosis Date Noted    No Resolved Ambulatory Problems     Past Medical History:   Diagnosis Date    Allergic rhinitis     Diabetes mellitus (CMS HCC)     DNS (deviated nasal septum)     Esophageal reflux     Essential hypertension     Hx of skin malignancy     Impacted cerumen of both ears        Past Surgical History:   Procedure Laterality Date    HX  TONSILLECTOMY  1959    SURGERY SCROTAL / TESTICULAR  2004    [facility]           Current Outpatient Medications   Medication Sig    aspirin 81 mg Oral Tablet, Chewable Chew 1 Tablet (81 mg total)    cetirizine (ZYRTEC) 10 mg Oral Tablet Take 1 Tablet (10 mg total) by mouth Once a day    cyclobenzaprine (FLEXERIL) 10 mg Oral Tablet Take 1 Tablet (10 mg total) by mouth Three times a day Indications: muscle spasm    ENBREL SURECLICK 50 mg/mL (1 mL) Subcutaneous Pen Injector     ergocalciferol, vitamin D2, (DRISDOL) 1,250 mcg (50,000 unit) Oral Capsule Take 1 Capsule (50,000 Units total) by mouth Every 7 days    famotidine (PEPCID) 40 mg Oral Tablet Take 1 Tablet (40 mg total) by mouth Every morning    fluticasone propionate (FLONASE) 50 mcg/actuation Nasal Spray, Suspension Administer 1 Spray into each nostril Once a day    furosemide (LASIX) 40 mg Oral Tablet TAKE 1 TABLET EVERY DAY IN THE MORNING AS NEEDED FOR EDEMA    glipiZIDE (GLUCOTROL XL) 5 mg Oral Tablet Extended Rel 24 hr TAKE 1 (ONE) TABLET BEFORE DINNER    JARDIANCE  25 mg Oral Tablet Take 1 Tablet (25 mg total) by mouth Once a day    losartan-hydrochlorothiazide (HYZAAR) 50-12.5 mg Oral Tablet Take 1 Tablet by mouth Once a day    MetFORMIN (GLUCOPHAGE) 1,000 mg Oral Tablet Take 1 Tablet (1,000 mg total) by mouth Twice daily    montelukast (SINGULAIR) 10 mg Oral Tablet Take 1 Tablet (10 mg total) by mouth Once a day    omeprazole (PRILOSEC) 40 mg Oral Capsule, Delayed Release(E.C.) TAKE 1 CAPSULE BY MOUTH EVERY DAY BEFORE DINNER    OZEMPIC 1 mg/dose (4 mg/3 mL) Subcutaneous Pen Injector Inject 1 mg under the skin Every 7 days    pioglitazone (ACTOS) 30 mg Oral Tablet Take 1 Tablet (30 mg total) by mouth Once a day    pravastatin (PRAVACHOL) 20 mg Oral Tablet Take 1 Tablet (20 mg total) by mouth    propranoloL (INDERAL) 10 mg Oral Tablet Take 1 Tablet (10 mg total) by mouth Twice daily    tamsulosin (FLOMAX) 0.4 mg Oral Capsule Take 1 Capsule (0.4 mg total) by mouth Every evening after dinner Indications: enlarged prostate with urination problem       Objective:     BP 128/79   Pulse 89   Temp 36.7 C (98.1 F)   Resp 18   Ht 1.854 m (6\' 1" )   Wt 122 kg (270 lb)   SpO2 96%   BMI 35.62 kg/m      General appearance: alert, cooperative, in no acute distress  Lungs: clear to auscultation bilaterally; no wheezes or rhonchi   Heart: regular rate and rhythm; normal S1 & S2; no murmur  Extremities: extremities normal ROM, no cyanosis or edema , no rash. No skin problems  Psych: alert and oriented x 3  Neuro: CN 2-12 grossly intact  Peripheral motor and sensory exams are grossly normal    Assessment/Plan     Assessment & Plan  Dilatation of thoracic aorta (CMS HCC)  He is scheduled for CT chest 10/24 due to his smoking hx.   CT angio chest 05/23 showed prox asc thoracic aorta 4.4cm  BP is controlled         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)  Advised pt to use  NSAIDs sparingly and try Tylenol for hand sxs.   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)  Enbrel       History of colon polyps  Recent neg colon ofr 5 yr f/u       Dyslipidemia  Pravastatin       Type 2 diabetes mellitus (CMS HCC)  Glipizide  Jardiance  MetforminActos  Ozempic  Losartan/HCTZ  Regular podiatry visits  Recent eye exam with Amy Keene  Followed by Sofi       Tobacco use disorder  Quit 2021       Hypertension, unspecified type    Orders:    losartan-hydrochlorothiazide (HYZAAR) 50-12.5 mg Oral Tablet; Take 1 Tablet by mouth Once a day               Health Maintenance:   Obtain flu shot next month at pharmacy                The patient was given the opportunity to ask questions and those questions were answered to the patient's satisfaction. The patient was encouraged to call with any additional questions or concerns. Instructed patient to call back if symptoms worse.   Discussed with patient effects and side effects of medications. Medication safety was discussed. A copy of the patient's medication list was printed and given to the patient. A good faith effort was made to reconcile the patient's medications.       Follow up: Return in about 4 months (around 05/22/2023).    Magdalene Patricia, MD

## 2023-01-20 NOTE — Assessment & Plan Note (Addendum)
Advised pt to use NSAIDs sparingly and try Tylenol for hand sxs.   Propranolol

## 2023-01-20 NOTE — Assessment & Plan Note (Addendum)
Recent neg colon ofr 5 yr f/u

## 2023-01-20 NOTE — Assessment & Plan Note (Addendum)
He is scheduled for CT chest 10/24 due to his smoking hx.   CT angio chest 05/23 showed prox asc thoracic aorta 4.4cm  BP is controlled

## 2023-02-10 ENCOUNTER — Other Ambulatory Visit (HOSPITAL_COMMUNITY): Payer: Self-pay | Admitting: Family

## 2023-02-10 DIAGNOSIS — K7460 Unspecified cirrhosis of liver: Secondary | ICD-10-CM

## 2023-02-17 ENCOUNTER — Other Ambulatory Visit: Payer: Self-pay

## 2023-02-17 ENCOUNTER — Encounter (INDEPENDENT_AMBULATORY_CARE_PROVIDER_SITE_OTHER): Payer: Self-pay | Admitting: OTOLARYNGOLOGY

## 2023-02-17 ENCOUNTER — Ambulatory Visit: Payer: Managed Care, Other (non HMO) | Attending: OTOLARYNGOLOGY | Admitting: OTOLARYNGOLOGY

## 2023-02-17 VITALS — Ht 73.0 in | Wt 270.0 lb

## 2023-02-17 DIAGNOSIS — H7291 Unspecified perforation of tympanic membrane, right ear: Secondary | ICD-10-CM | POA: Insufficient documentation

## 2023-02-17 DIAGNOSIS — H6993 Unspecified Eustachian tube disorder, bilateral: Secondary | ICD-10-CM

## 2023-02-17 DIAGNOSIS — J309 Allergic rhinitis, unspecified: Secondary | ICD-10-CM

## 2023-02-17 DIAGNOSIS — H6123 Impacted cerumen, bilateral: Secondary | ICD-10-CM | POA: Insufficient documentation

## 2023-02-17 DIAGNOSIS — J310 Chronic rhinitis: Secondary | ICD-10-CM | POA: Insufficient documentation

## 2023-02-17 DIAGNOSIS — J342 Deviated nasal septum: Secondary | ICD-10-CM | POA: Insufficient documentation

## 2023-02-17 MED ORDER — MONTELUKAST 10 MG TABLET
10.0000 mg | ORAL_TABLET | Freq: Every day | ORAL | 3 refills | Status: DC
Start: 2023-02-17 — End: 2023-08-17

## 2023-02-17 MED ORDER — CETIRIZINE 10 MG TABLET
10.0000 mg | ORAL_TABLET | Freq: Every day | ORAL | 3 refills | Status: DC
Start: 2023-02-17 — End: 2023-08-17

## 2023-02-17 MED ORDER — FLUTICASONE PROPIONATE 50 MCG/ACTUATION NASAL SPRAY,SUSPENSION
1.0000 | Freq: Every day | NASAL | 3 refills | Status: DC
Start: 2023-02-17 — End: 2024-02-22

## 2023-02-17 NOTE — H&P (Signed)
[facility]  ENT, [facility]    Progress Note    Name: CHANTRY HEADEN MRN:  [id number]   Date: 02/17/2023 Age: 72 y.o.          Follow Up      Subjective:   Chief Complaint:   Follow Up 6 Months (6 month rc on ears and allergies. )       History of Present Illness:  Miguel Hendricks is a 72 y.o. old male who presents to the clinic for follow-up. Patient states that allergy symptoms have been well controlled. He denies any changes in hearing or otorrhea. Tymps are Type B Ad with increased CV and hypomobile As.    Review of Systems     Physical Exam:     Vitals:    02/17/23 1137   Weight: 122 kg (270 lb)   Height: 1.854 m (6\' 1" )   BMI: 35.7      ENT Physical Exam  Constitutional  Appearance: patient appears well-developed, well-nourished and well-groomed,  Communication/Voice: communication appropriate for developmental age; vocal quality normal;  Head and Face  Appearance: head appears normal, face appears normal and face appears atraumatic;  Palpation: facial palpation normal;  Salivary: glands normal;  Ear  Hearing: intact;  Auricles: right auricle normal; left auricle normal;  External Mastoids: right external mastoid normal; left external mastoid normal;  Ear Canals: bilateral ear canals impacted cerumen observed;  Tympanic Membranes: left tympanic membrane normal;  Ear comments: 40% perforation dry  Nose  External Nose: nares patent bilaterally; external nose normal;  Internal Nose: nasal mucosa normal; septum normal; bilateral inferior turbinates normal;  Oral Cavity/Oropharynx  Lips: normal;  Teeth: normal;  Gums: gingiva normal;  Tongue: normal;  Oral mucosa: normal;  Hard palate: normal;  Soft palate: normal;  Tonsils: normal;  Base of Tongue: normal;  Posterior pharyngeal wall: normal;  Neck  Neck: neck normal; neck palpation normal;  Thyroid: thyroid normal;  Respiratory  Inspection: breathing unlabored; normal breathing rate;  Lymphatic  Palpation: lymph nodes normal;  Neurovestibular  Mental Status:  alert and oriented;  Psychiatric: mood normal; affect is appropriate;  Cranial Nerves: cranial nerves intact;       Assessment and Plan:       ICD-10-CM    1. ETD (Eustachian tube dysfunction), bilateral  H69.93       2. Nasal septal deviation  J34.2       3. Chronic rhinitis  J31.0       4. Allergic rhinitis, unspecified seasonality, unspecified trigger  J30.9 31231 - NASAL ENDOSCOPY DIAGNOSTIC UNILATERAL OR BILATERAL (AMB ONLY)     POCT HEARING/VISION/TYMPANOGRAM (AMB ONLY)      5. Tympanic membrane perforation, right  H72.91         Orders Placed This Encounter    31231 - NASAL ENDOSCOPY DIAGNOSTIC UNILATERAL OR BILATERAL (AMB ONLY)    POCT HEARING/VISION/TYMPANOGRAM (AMB ONLY)    cetirizine (ZYRTEC) 10 mg Oral Tablet    fluticasone propionate (FLONASE) 50 mcg/actuation Nasal Spray, Suspension    montelukast (SINGULAIR) 10 mg Oral Tablet     Continue Flonase, zyrtec and Singulair.  Cerumen removed  Perforation stable.   Dry ear precautions discussed    Follow up:  Return in about 6 months (around 08/18/2023).    Conchita Paris, DO

## 2023-02-17 NOTE — Procedures (Signed)
ENT, [facility]  [address]  [place] [address]  Operated by [facility]  Procedure Note    Name: Miguel Hendricks MRN:  [id number]   Date: 02/17/2023 DOB:  [birth year] (71 y.o.)         31231 - NASAL ENDOSCOPY DIAGNOSTIC UNILATERAL OR BILATERAL (AMB ONLY)    Performed by: Conchita Paris, DO  Authorized by: Conchita Paris, DO    Time Out:     Immediately before the procedure, a time out was called:  Yes    Patient verified:  Yes    Procedure Verified:  Yes    Site Verified:  Yes  Documentation:      ENT, [facility]  [address]  [place] [address]  Operated by [facility]  Procedure Note    Name: Miguel Hendricks MRN:  [id number]  Date: 02/17/2023 DOB:  09-21-1950 (72 y.o.)        @PROCDOC @    Indications for procedure: Monitor chronic disease    Anesthesia: Oxymetazoline nasal spray    Description: Nasal endoscopy with rigid scope was performed with examination of the  septum, inferior, middle, and superior meatus, turbinates, sphenoethmoidal recess, and nasopharynx.     There were no polyps, pus, or granulation tissue noted.  ET orifices and nasopharynx were normal.     Findings: Allergic rhinitis, DNS    The patient tolerated the procedure well.    Conchita Paris, DO             Maika Mcelveen Weston, DO

## 2023-02-18 ENCOUNTER — Ambulatory Visit
Admission: RE | Admit: 2023-02-18 | Discharge: 2023-02-18 | Disposition: A | Discharge status: Home / Self Care | Payer: Managed Care, Other (non HMO) | Source: Ambulatory Visit | Attending: Internal Medicine | Admitting: Internal Medicine

## 2023-02-18 DIAGNOSIS — F17200 Nicotine dependence, unspecified, uncomplicated: Secondary | ICD-10-CM | POA: Insufficient documentation

## 2023-02-19 ENCOUNTER — Encounter (INDEPENDENT_AMBULATORY_CARE_PROVIDER_SITE_OTHER): Payer: Self-pay | Admitting: Internal Medicine

## 2023-02-19 DIAGNOSIS — K7460 Unspecified cirrhosis of liver: Secondary | ICD-10-CM | POA: Insufficient documentation

## 2023-02-19 NOTE — Result Encounter Note (Signed)
Let pt know that the CT scan of his chest did not show anything worrisome.  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  Make sure that he does not drink ANY alcohol.  It may be from fatty liver but any alcohol ingestion could push him over the edge.     I have orders in for lab work so make sure that he has it before his next visit with me.     Thanks.

## 2023-02-23 NOTE — Result Encounter Note (Signed)
Let pt know that the CT scan did not show any worrisome changes in his lungs.    He does have cirrhosis of the liver and he needs to avoid any and all alcohol.    He also has a lot of cholesterol calcifications around his heart.  Does he see a cardiologist?    We will discuss this at his next visit.

## 2023-02-23 NOTE — Result Encounter Note (Signed)
Spoke with patient. He voiced understanding.

## 2023-02-25 ENCOUNTER — Ambulatory Visit (HOSPITAL_COMMUNITY): Payer: Managed Care, Other (non HMO) | Admitting: Orthopaedic Surgery

## 2023-02-25 ENCOUNTER — Ambulatory Visit: Payer: Managed Care, Other (non HMO) | Attending: Orthopaedic Surgery

## 2023-02-25 ENCOUNTER — Other Ambulatory Visit: Payer: Self-pay

## 2023-02-25 DIAGNOSIS — M25461 Effusion, right knee: Secondary | ICD-10-CM | POA: Insufficient documentation

## 2023-02-25 LAB — SYNOVIAL FLUID: NUCLEATED CELLS, FLUID: 16960 /uL

## 2023-02-25 LAB — SYNOVIAL (JOINT) FLUID CRYSTALS

## 2023-02-25 LAB — SYNOVIAL FLUID DIFFERENTIAL
NEUTROPHIL %: 41 %
OTHER CELL %: 59 %

## 2023-03-01 ENCOUNTER — Other Ambulatory Visit (INDEPENDENT_AMBULATORY_CARE_PROVIDER_SITE_OTHER): Payer: Self-pay | Admitting: Internal Medicine

## 2023-03-01 DIAGNOSIS — M545 Low back pain, unspecified: Secondary | ICD-10-CM

## 2023-03-02 LAB — FLUID CULTURE & GRAM STAIN
FLC: NO GROWTH
GRAM STAIN: NONE SEEN

## 2023-03-27 ENCOUNTER — Encounter (HOSPITAL_COMMUNITY): Payer: Self-pay

## 2023-04-07 ENCOUNTER — Ambulatory Visit: Payer: Managed Care, Other (non HMO) | Attending: Family

## 2023-04-07 ENCOUNTER — Other Ambulatory Visit: Payer: Self-pay

## 2023-04-07 DIAGNOSIS — Z01818 Encounter for other preprocedural examination: Secondary | ICD-10-CM | POA: Insufficient documentation

## 2023-04-07 LAB — COMPREHENSIVE METABOLIC PANEL, NON-FASTING
ALBUMIN/GLOBULIN RATIO: 1 (ref 0.8–1.4)
ALBUMIN: 4.3 g/dL (ref 3.5–5.7)
ALKALINE PHOSPHATASE: 67 U/L (ref 34–104)
ALT (SGPT): 49 U/L (ref 7–52)
ANION GAP: 8 mmol/L (ref 4–13)
AST (SGOT): 56 U/L — ABNORMAL HIGH (ref 13–39)
BILIRUBIN TOTAL: 0.5 mg/dL (ref 0.3–1.0)
BUN/CREA RATIO: 16 (ref 6–22)
BUN: 13 mg/dL (ref 7–25)
CALCIUM, CORRECTED: 9.8 mg/dL (ref 8.9–10.8)
CALCIUM: 10 mg/dL (ref 8.6–10.3)
CHLORIDE: 103 mmol/L (ref 98–107)
CO2 TOTAL: 27 mmol/L (ref 21–31)
CREATININE: 0.83 mg/dL (ref 0.60–1.30)
ESTIMATED GFR: 94 mL/min/{1.73_m2} (ref >59)
GLOBULIN: 4.1 — ABNORMAL HIGH (ref 2.0–3.5)
GLUCOSE: 191 mg/dL — ABNORMAL HIGH (ref 74–109)
OSMOLALITY, CALCULATED: 281 mosm/kg (ref 270–290)
POTASSIUM: 4.3 mmol/L (ref 3.5–5.1)
PROTEIN TOTAL: 8.4 g/dL (ref 6.4–8.9)
SODIUM: 138 mmol/L (ref 136–145)

## 2023-04-07 LAB — CBC
HCT: 40.2 % (ref 36.7–47.1)
HGB: 14 g/dL (ref 12.5–16.3)
MCH: 30.6 pg (ref 23.8–33.4)
MCHC: 34.8 g/dL (ref 32.5–36.3)
MCV: 88 fL (ref 73.0–96.2)
MPV: 9.1 fL (ref 7.4–11.4)
PLATELETS: 196 10*3/uL (ref 140–440)
RBC: 4.57 10*6/uL (ref 4.06–5.63)
RDW: 14.9 % (ref 12.1–16.2)
WBC: 5.1 10*3/uL (ref 3.6–10.2)

## 2023-04-08 ENCOUNTER — Ambulatory Visit
Admission: RE | Admit: 2023-04-08 | Discharge: 2023-04-08 | Disposition: A | Discharge status: Home / Self Care | Payer: Managed Care, Other (non HMO) | Source: Ambulatory Visit | Attending: Family | Admitting: Family

## 2023-04-08 DIAGNOSIS — K7460 Unspecified cirrhosis of liver: Secondary | ICD-10-CM | POA: Insufficient documentation

## 2023-04-24 ENCOUNTER — Ambulatory Visit
Admission: RE | Admit: 2023-04-24 | Discharge: 2023-04-24 | Disposition: A | Discharge status: Home / Self Care | Payer: Managed Care, Other (non HMO) | Source: Ambulatory Visit | Attending: Internal Medicine | Admitting: Internal Medicine

## 2023-04-24 ENCOUNTER — Ambulatory Visit (HOSPITAL_COMMUNITY): Payer: Managed Care, Other (non HMO) | Admitting: Certified Registered"

## 2023-04-24 ENCOUNTER — Ambulatory Visit (HOSPITAL_COMMUNITY): Payer: Managed Care, Other (non HMO) | Admitting: Internal Medicine

## 2023-04-24 ENCOUNTER — Other Ambulatory Visit: Payer: Self-pay

## 2023-04-24 ENCOUNTER — Encounter (HOSPITAL_COMMUNITY): Payer: Self-pay | Admitting: Internal Medicine

## 2023-04-24 ENCOUNTER — Encounter (HOSPITAL_COMMUNITY)
Admission: RE | Disposition: A | Discharge status: Home / Self Care | Payer: Self-pay | Source: Ambulatory Visit | Attending: Internal Medicine

## 2023-04-24 DIAGNOSIS — K2970 Gastritis, unspecified, without bleeding: Secondary | ICD-10-CM | POA: Insufficient documentation

## 2023-04-24 DIAGNOSIS — I8510 Secondary esophageal varices without bleeding: Secondary | ICD-10-CM | POA: Insufficient documentation

## 2023-04-24 DIAGNOSIS — K449 Diaphragmatic hernia without obstruction or gangrene: Secondary | ICD-10-CM | POA: Insufficient documentation

## 2023-04-24 DIAGNOSIS — Z87891 Personal history of nicotine dependence: Secondary | ICD-10-CM | POA: Insufficient documentation

## 2023-04-24 DIAGNOSIS — E669 Obesity, unspecified: Secondary | ICD-10-CM | POA: Insufficient documentation

## 2023-04-24 DIAGNOSIS — Z7984 Long term (current) use of oral hypoglycemic drugs: Secondary | ICD-10-CM | POA: Insufficient documentation

## 2023-04-24 DIAGNOSIS — Z6833 Body mass index (BMI) 33.0-33.9, adult: Secondary | ICD-10-CM | POA: Insufficient documentation

## 2023-04-24 DIAGNOSIS — E119 Type 2 diabetes mellitus without complications: Secondary | ICD-10-CM | POA: Insufficient documentation

## 2023-04-24 DIAGNOSIS — E785 Hyperlipidemia, unspecified: Secondary | ICD-10-CM | POA: Insufficient documentation

## 2023-04-24 DIAGNOSIS — I10 Essential (primary) hypertension: Secondary | ICD-10-CM | POA: Insufficient documentation

## 2023-04-24 DIAGNOSIS — Z8719 Personal history of other diseases of the digestive system: Secondary | ICD-10-CM | POA: Insufficient documentation

## 2023-04-24 DIAGNOSIS — K2100 Gastro-esophageal reflux disease with esophagitis, without bleeding: Secondary | ICD-10-CM | POA: Insufficient documentation

## 2023-04-24 DIAGNOSIS — K7460 Unspecified cirrhosis of liver: Secondary | ICD-10-CM | POA: Insufficient documentation

## 2023-04-24 SURGERY — GASTROSCOPY WITH BANDING
Anesthesia: General | Site: Mouth | Wound class: Clean Contaminated Wounds-The respiratory, GI, Genital, or urinary

## 2023-04-24 MED ORDER — LIDOCAINE (PF) 100 MG/5 ML (2 %) INTRAVENOUS SYRINGE
INJECTION | Freq: Once | INTRAVENOUS | Status: DC | PRN
Start: 2023-04-24 — End: 2023-04-24
  Administered 2023-04-24: 100 mg via INTRAVENOUS

## 2023-04-24 MED ORDER — LACTATED RINGERS INTRAVENOUS SOLUTION
INTRAVENOUS | Status: DC | PRN
Start: 2023-04-24 — End: 2023-04-24

## 2023-04-24 MED ORDER — PROPOFOL 10 MG/ML INTRAVENOUS EMULSION
Freq: Once | INTRAVENOUS | Status: DC | PRN
Start: 2023-04-24 — End: 2023-04-24
  Administered 2023-04-24: 15 mL via INTRAVENOUS

## 2023-04-24 SURGICAL SUPPLY — 2 items
DETERGENT INSTR 22OZ TRNSPT GEL RINSE FREE NEUT PH PREKLENZ CLR PLSNT LF (MISCELLANEOUS PT CARE ITEMS) ×1 IMPLANT
LIGATOR 2.8MM 8.6-11.5MM SSS7 ESOPH 1 STNG MLT BAND HNDL STRL DISP ENDOS HMSTS LF (ENDOSCOPIC SUPPLIES) ×1 IMPLANT

## 2023-04-24 NOTE — Anesthesia Postprocedure Evaluation (Signed)
Anesthesia Post Op Evaluation    Patient: Miguel Hendricks  Procedure(s):  EGD WITH BANDING    Last Vitals:Temperature: 36.4 C (97.6 F) (04/24/23 1046)  Heart Rate: 87 (04/24/23 1046)  BP (Non-Invasive): (!) 144/85 (04/24/23 1046)  Respiratory Rate: 16 (04/24/23 1046)  SpO2: 96 % (04/24/23 1046)    No notable events documented.    Patient is sufficiently recovered from the effects of anesthesia to participate in the evaluation and has returned to their pre-procedure level.  Patient location during evaluation: PACU       Patient participation: complete - patient participated  Level of consciousness: awake and alert and responsive to verbal stimuli    Pain score: 0  Pain management: adequate  Airway patency: patent    Anesthetic complications: no  Cardiovascular status: acceptable  Respiratory status: acceptable  Hydration status: acceptable  Patient post-procedure temperature: Pt Normothermic   PONV Status: Absent

## 2023-04-24 NOTE — H&P (Signed)
[facility]  Gastroenterology/ Hepatology Outpatient H & P Note      Joshau, Kishbaugh, 72 y.o. male  Encounter Start Date:  04/24/2023    Date of Birth:  [birth year]    Date of Service: 04/24/23       INIGO COOPERSTEIN is a 72 y.o., White male who presents to the outpatient endoscopy department at [facility] for evaluation and treatment of the GI symptoms and problems.   No new symptoms. No noted bleeding. Meds reviewed.     REVIEW OF SYSTEMS:    Review of Systems was completed with pertinent ROS as addressed in HPI.          Medications Prior to Admission       Prescriptions    aspirin 81 mg Oral Tablet, Chewable    Chew 1 Tablet (81 mg total)    cetirizine (ZYRTEC) 10 mg Oral Tablet    Take 1 Tablet (10 mg total) by mouth Once a day    cyclobenzaprine (FLEXERIL) 10 mg Oral Tablet    TAKE 1 TABLET (10 MG TOTAL) BY MOUTH THREE TIMES A DAY INDICATIONS: MUSCLE SPASM    ENBREL SURECLICK 50 mg/mL (1 mL) Subcutaneous Pen Injector    ergocalciferol, vitamin D2, (DRISDOL) 1,250 mcg (50,000 unit) Oral Capsule    Take 1 Capsule (50,000 Units total) by mouth Every 7 days    famotidine (PEPCID) 40 mg Oral Tablet    Take 1 Tablet (40 mg total) by mouth Every morning    fluticasone propionate (FLONASE) 50 mcg/actuation Nasal Spray, Suspension    Administer 1 Spray into each nostril Once a day    furosemide (LASIX) 40 mg Oral Tablet    TAKE 1 TABLET EVERY DAY IN THE MORNING AS NEEDED FOR EDEMA    glipiZIDE (GLUCOTROL XL) 5 mg Oral Tablet Extended Rel 24 hr    TAKE 1 (ONE) TABLET BEFORE DINNER    JARDIANCE 25 mg Oral Tablet    Take 1 Tablet (25 mg total) by mouth Once a day    losartan-hydrochlorothiazide (HYZAAR) 50-12.5 mg Oral Tablet    Take 1 Tablet by mouth Once a day    MetFORMIN (GLUCOPHAGE) 1,000 mg Oral Tablet    Take 1 Tablet (1,000 mg total) by mouth Twice daily    montelukast (SINGULAIR) 10 mg Oral Tablet    Take 1 Tablet (10 mg total) by mouth Once a day    omeprazole  (PRILOSEC) 40 mg Oral Capsule, Delayed Release(E.C.)    TAKE 1 CAPSULE BY MOUTH EVERY DAY BEFORE DINNER    OZEMPIC 1 mg/dose (4 mg/3 mL) Subcutaneous Pen Injector    Inject 1 mg under the skin Every 7 days    pioglitazone (ACTOS) 30 mg Oral Tablet    Take 1 Tablet (30 mg total) by mouth Once a day    pravastatin (PRAVACHOL) 20 mg Oral Tablet    Take 1 Tablet (20 mg total) by mouth    propranoloL (INDERAL) 10 mg Oral Tablet    Take 1 Tablet (10 mg total) by mouth Twice daily    tamsulosin (FLOMAX) 0.4 mg Oral Capsule    Take 1 Capsule (0.4 mg total) by mouth Every evening after dinner Indications: enlarged prostate with urination problem             Allergies:    No Known Allergies        Home Meds:      Prior to Admission medications    Medication Sig  Start Date End Date Taking? Authorizing Provider   aspirin 81 mg Oral Tablet, Chewable Chew 1 Tablet (81 mg total)   Yes Provider, Historical   cetirizine (ZYRTEC) 10 mg Oral Tablet Take 1 Tablet (10 mg total) by mouth Once a day 02/17/23   Weitzel, Loraine Leriche, DO   cyclobenzaprine (FLEXERIL) 10 mg Oral Tablet TAKE 1 TABLET (10 MG TOTAL) BY MOUTH THREE TIMES A DAY INDICATIONS: MUSCLE SPASM 03/02/23   Magdalene Patricia, MD   ENBREL SURECLICK 50 mg/mL (1 mL) Subcutaneous Pen Injector  07/31/21   Provider, Historical   ergocalciferol, vitamin D2, (DRISDOL) 1,250 mcg (50,000 unit) Oral Capsule Take 1 Capsule (50,000 Units total) by mouth Every 7 days 03/25/22   Provider, Historical   famotidine (PEPCID) 40 mg Oral Tablet Take 1 Tablet (40 mg total) by mouth Every morning 06/14/21   Provider, Historical   fluticasone propionate (FLONASE) 50 mcg/actuation Nasal Spray, Suspension Administer 1 Spray into each nostril Once a day 02/17/23   Weitzel, Mark, DO   furosemide (LASIX) 40 mg Oral Tablet TAKE 1 TABLET EVERY DAY IN THE MORNING AS NEEDED FOR EDEMA 05/16/21   Provider, Historical   glipiZIDE (GLUCOTROL XL) 5 mg Oral Tablet Extended Rel 24 hr TAKE 1 (ONE) TABLET BEFORE DINNER 04/03/21    Provider, Historical   JARDIANCE 25 mg Oral Tablet Take 1 Tablet (25 mg total) by mouth Once a day 09/25/22   Provider, Historical   losartan-hydrochlorothiazide (HYZAAR) 50-12.5 mg Oral Tablet Take 1 Tablet by mouth Once a day 01/20/23 01/20/24  Magdalene Patricia, MD   MetFORMIN (GLUCOPHAGE) 1,000 mg Oral Tablet Take 1 Tablet (1,000 mg total) by mouth Twice daily 04/03/21   Provider, Historical   montelukast (SINGULAIR) 10 mg Oral Tablet Take 1 Tablet (10 mg total) by mouth Once a day 02/17/23   Conchita Paris, DO   omeprazole (PRILOSEC) 40 mg Oral Capsule, Delayed Release(E.C.) TAKE 1 CAPSULE BY MOUTH EVERY DAY BEFORE DINNER 05/29/21   Provider, Historical   OZEMPIC 1 mg/dose (4 mg/3 mL) Subcutaneous Pen Injector Inject 1 mg under the skin Every 7 days 11/11/22   Provider, Historical   pioglitazone (ACTOS) 30 mg Oral Tablet Take 1 Tablet (30 mg total) by mouth Once a day 05/24/21   Provider, Historical   pravastatin (PRAVACHOL) 20 mg Oral Tablet Take 1 Tablet (20 mg total) by mouth 06/01/21   Provider, Historical   propranoloL (INDERAL) 10 mg Oral Tablet Take 1 Tablet (10 mg total) by mouth Twice daily 07/14/21   Provider, Historical   tamsulosin (FLOMAX) 0.4 mg Oral Capsule Take 1 Capsule (0.4 mg total) by mouth Every evening after dinner Indications: enlarged prostate with urination problem 10/20/22 10/20/23  Magdalene Patricia, MD          Inpatient Meds:      No current facility-administered medications for this encounter.           PMHx:    Past Medical History:   Diagnosis Date    Allergic rhinitis     Diabetes mellitus (CMS HCC)     DNS (deviated nasal septum)     Esophageal reflux     Essential hypertension     Hx of skin malignancy     Impacted cerumen of both ears         PSHx:   Past Surgical History:   Procedure Laterality Date    HX TONSILLECTOMY  1959    SURGERY SCROTAL / TESTICULAR  2004  [facility]          Family History  Family Medical History:    None        Social History  Social History     Tobacco Use     Smoking status: Former     Current packs/day: 0.00     Average packs/day: 1 pack/day for 50.0 years (50.0 ttl pk-yrs)     Types: Cigarettes     Start date: 78     Quit date: 2021     Years since quitting: 3.9    Smokeless tobacco: Never    Tobacco comments:     Counseled by DdrGaynell Face on 10/20/22   Substance Use Topics    Alcohol use: Not Currently    Drug use: Not Currently                   Vital Signs:  Temp (24hrs) Max:36.7 C (98 F)      Systolic (24hrs), Avg:138 , Min:124 , Max:153     Diastolic (24hrs), Avg:80, Min:45, Max:104    Temp  Avg: 36.6 C (97.8 F)  Min: 36.4 C (97.6 F)  Max: 36.7 C (98 F)  MAP (Non-Invasive)  Avg: 97.9 mmHG  Min: 67 mmHG  Max: 115 mmHG  Pulse  Avg: 81.5  Min: 73  Max: 87  Resp  Avg: 20  Min: 16  Max: 28  SpO2  Avg: 95.7 %  Min: 93 %  Max: 98 %           PHYSICAL EXAM:   Temperature: 36.4 C (97.6 F)  Heart Rate: 75  BP (Non-Invasive): 138/78  Respiratory Rate: 20  SpO2: 93 %    HEENT normal  Lungs Clear  Card RRR  Abdomen soft non-tender     RELEVANT LABS:  Labs:  No results found for: "CARCIEMBAG", "MQAD24", "CAAGGICA199", "CA199"   CBC  Diff   Lab Results   Component Value Date/Time    WBC 5.1 04/07/2023 02:56 PM    HGB 14.0 04/07/2023 02:56 PM    HCT 40.2 04/07/2023 02:56 PM    PLTCNT 196 04/07/2023 02:56 PM    RBC 4.57 04/07/2023 02:56 PM    MCV 88.0 04/07/2023 02:56 PM    MCHC 34.8 04/07/2023 02:56 PM    MCH 30.6 04/07/2023 02:56 PM    RDW 14.9 04/07/2023 02:56 PM    MPV 9.1 04/07/2023 02:56 PM    Lab Results   Component Value Date/Time    PMNS 41 02/25/2023 10:05 AM         @LASTCMP @  CEA  No results found for: "CARCIEMBAG"           Assessment:  Hx Esophageal Varices  2.    Cirrhosis    Plan and Recommendations:  EGD with possible banding      Rogers Seeds, MD

## 2023-04-24 NOTE — Anesthesia Preprocedure Evaluation (Signed)
ANESTHESIA PRE-OP EVALUATION  Planned Procedure: EGD WITH BANDING (Mouth)  Review of Systems     anesthesia history negative               Pulmonary   past history of smoking ,   Cardiovascular    Hypertension, ECG reviewed and hyperlipidemia ,No peripheral edema,  Exercise Tolerance: > or = 4 METS   ,beta blocker therapy  ,taken in last 24 hours     GI/Hepatic/Renal    hiatal hernia, GERD and liver disease        Endo/Other    obesity,   type 2 diabetes/ controlled    Neuro/Psych/MS   negative neuro/psych ROS,      Cancer    negative hematology/oncology ROS,               Physical Assessment      Airway       Mallampati: III    TM distance: <3 FB    Mouth Opening: good.  Facial hair          Dental           (+) poor dentition, edentulous           Pulmonary    Breath sounds clear to auscultation  (-) no rhonchi, no decreased breath sounds, no wheezes, no rales and no stridor     Cardiovascular    Rhythm: regular  Rate: Normal  (-) no friction rub, carotid bruit is not present, no peripheral edema and no murmur     Other findings          Plan  ASA 3     Planned anesthesia type: general     total intravenous anesthesia                          Anesthetic plan and risks discussed with patient  signed consent obtained          Patient's NPO status is appropriate for Anesthesia.

## 2023-04-24 NOTE — Discharge Instructions (Addendum)
SURGICAL DISCHARGE INSTRUCTIONS     Dr. Rogers Seeds, MD  performed your EGD WITH BANDING today at the [facility]    [facility]:  Monday through Friday from 8 a.m. - 4 p.m.: [phone]    For T&D: [phone]  Between 4 p.m. - 8 a.m., weekends and holidays:  Call ER [phone]    PLEASE SEE WRITTEN HANDOUTS AS DISCUSSED BY YOUR NURSE:  Fleet Contras Zakir Henner RN    SIGNS AND SYMPTOMS OF A WOUND / INCISION INFECTION   Be sure to watch for the following:  Increase in redness or red streaks near or around the wound or incision.  Increase in pain that is intense or severe and cannot be relieved by the pain medication that your doctor has given you.  Increase in swelling that cannot be relieved by elevation of a body part, or by applying ice, if permitted.  Increase in drainage, or if yellow / green in color and smells bad. This could be on a dressing or a cast.  Increase in fever for longer than 24 hours, or an increase that is higher than 101 degrees Fahrenheit (normal body temperature is 98 degrees Fahrenheit). The incision may feel warm to the touch.    **CALL YOUR DOCTOR IF ONE OR MORE OF THESE SIGNS / SYMPTOMS SHOULD OCCUR.    ANESTHESIA INFORMATION   ANESTHESIA -- ADULT PATIENTS:  You have received intravenous sedation / general anesthesia, and you may feel drowsy and light-headed for several hours. You may even experience some forgetfulness of the procedure. DO NOT DRIVE A MOTOR VEHICLE or perform any activity requiring complete alertness or coordination until you feel fully awake in about 24-48 hours. Do not drink alcoholic beverages for at least 24 hours. Do not stay alone, you must have a responsible adult available to be with you. You may also experience a dry mouth or nausea for 24 hours. This is a normal side effect and will disappear as the effects of the medication wear off.    REMEMBER   If you experience any difficulty breathing, chest pain, bleeding that you  feel is excessive, persistent nausea or vomiting or for any other concerns:  Call your physician Dr.  Rogers Seeds, MD at [phone]. You may also ask to have the general doctor on call paged. They are available to you 24 hours a day.      SPECIAL INSTRUCTIONS / COMMENTS   Follow up with Dr. Allena Katz on March 4th @ 10:45am.   The findings of your procedure today were: +2 varices with 1 varice banded and residual food.     FOLLOW-UP APPOINTMENTS   Please call your surgeon's office at the number listed to schedule a date / time of return for follow-up.     Dr Rogers Seeds [phone]

## 2023-04-24 NOTE — OR Surgeon (Signed)
[facility]      EGD NOTE               Patient Name: Miguel Hendricks  Age:  72 y.o.  Sex:  male  MRN:  [id number]  CSN:  [id number]  Date of Birth: January 24, 1951    Medications Given: Per Nurse Anesthetist    Equipment Used: Olympus [id number]    Date: 04/24/23     Indications:   GERD, history of cirrhosis, history of esophageal varices,     The scope was passed into the esophagus without difficulty under direct visualization. There was mild erythema at the distal esophagus. +1 to +2 esophageal varices were seen without signs of bleeding. A small hiatal hernia was seen. The mucosa in the cardia appeared normal. The diaphragmatic impingement on retroflexion view appeared normal. The Z line appeared slightly irregular.      The mucosa in the fundus was normal. The mucosa in the body and antrum had moderate erythema. The mucosa in the duodenal bulb was normal. The mucosa past the duodenal bulb to the 2nd portion of the duodenum appeared normal. The ampulla was not visualized.      The [employer] speed band equipment was applied and 1 varix was successfully banded in the distal esophagus with good hemostasis. Biopsies were not taken. Photograph(s) Taken: Yes; Blood loss: trace;     Impression:  Mild distal esophagitis, grade 1  +1 to +2 esophageal varices (moderate size), 1 varix banded as described above  Small hiatal hernia  Moderate gastritis     Recommendations:  Continue Omeprazole 40 mg po q day, before dinner  Continue Famotidine 40 mg po bid, before breakfast and q hs  Continue Propranolol 10 mg po bid for portal hypertension  Maintain antireflux precautions including head of bed elevation.  Repeat EGD in 1 year to reassess the esophageal varices     Rogers Seeds, MD

## 2023-05-22 ENCOUNTER — Encounter (INDEPENDENT_AMBULATORY_CARE_PROVIDER_SITE_OTHER): Payer: Self-pay | Admitting: Internal Medicine

## 2023-06-16 ENCOUNTER — Other Ambulatory Visit (INDEPENDENT_AMBULATORY_CARE_PROVIDER_SITE_OTHER): Payer: Self-pay | Admitting: Internal Medicine

## 2023-06-16 DIAGNOSIS — F17200 Nicotine dependence, unspecified, uncomplicated: Secondary | ICD-10-CM

## 2023-06-24 ENCOUNTER — Encounter (INDEPENDENT_AMBULATORY_CARE_PROVIDER_SITE_OTHER): Payer: Self-pay | Admitting: Internal Medicine

## 2023-06-24 ENCOUNTER — Ambulatory Visit (INDEPENDENT_AMBULATORY_CARE_PROVIDER_SITE_OTHER): Payer: Managed Care, Other (non HMO) | Admitting: Internal Medicine

## 2023-06-24 ENCOUNTER — Other Ambulatory Visit: Payer: Self-pay

## 2023-06-24 VITALS — BP 137/74 | HR 84 | Temp 97.3°F | Resp 20 | Ht 73.0 in | Wt 268.0 lb

## 2023-06-24 DIAGNOSIS — K7460 Unspecified cirrhosis of liver: Secondary | ICD-10-CM

## 2023-06-24 DIAGNOSIS — Z7985 Long-term (current) use of injectable non-insulin antidiabetic drugs: Secondary | ICD-10-CM

## 2023-06-24 DIAGNOSIS — E119 Type 2 diabetes mellitus without complications: Secondary | ICD-10-CM

## 2023-06-24 DIAGNOSIS — Z7984 Long term (current) use of oral hypoglycemic drugs: Secondary | ICD-10-CM

## 2023-06-24 DIAGNOSIS — R0609 Other forms of dyspnea: Secondary | ICD-10-CM

## 2023-06-24 DIAGNOSIS — M7989 Other specified soft tissue disorders: Secondary | ICD-10-CM

## 2023-06-24 DIAGNOSIS — Z6835 Body mass index (BMI) 35.0-35.9, adult: Secondary | ICD-10-CM

## 2023-06-24 DIAGNOSIS — I2510 Atherosclerotic heart disease of native coronary artery without angina pectoris: Secondary | ICD-10-CM

## 2023-06-24 DIAGNOSIS — Z1159 Encounter for screening for other viral diseases: Secondary | ICD-10-CM

## 2023-06-24 NOTE — Progress Notes (Signed)
FAMILY MEDICINE, MEDICAL OFFICE BUILDING  [address]  [place] [address]      Miguel Hendricks  10-08-1950  [id number]    Date of Service: 06/24/2023  9:00 AM EST    Chief complaint:   Chief Complaint   Patient presents with    Follow Up 4 Months     Patient is here for CDM. Patient reports edema in bilateral lower extremities. He denies shortness of breath.        Subjective:     This is a case of a 73 y.o. year old male who comes in today for CDM.    Recent lab results reviewed and noted-  Pt reports A1C of % of 5.8% ordered by Sofi.      Vit D also checked by Sofi    abs  Saikaili 05/24 Normal CBC except for platelet count 112.  Nl CMP CREAT 0.74/BUN 15/GFR 97    He sees Dr. Allena Katz about every 6 months.     LIVER TESTS  Lab Results   Component Value Date    ALBUMIN 4.3 04/07/2023    AST 56 (H) 04/07/2023    ALT 49 04/07/2023    ALKPHOS 67 04/07/2023    TOTBILIRUBIN 0.5 04/07/2023       Wt Readings from Last 6 Encounters:   06/24/23 122 kg (268 lb)   04/24/23 115 kg (253 lb)   02/17/23 122 kg (270 lb)   01/20/23 122 kg (270 lb)   10/20/22 123 kg (270 lb 3.2 oz)   08/21/22 116 kg (255 lb)     He notes some pedel edema at the end of the day.  He notes some SOB w/ bending over. He may have DOE.      His last stress test was around 2010.  He was sent to [facility] for CC but did not require intervention.  He has had no cardiology f/u since that time.     LDCT:  MPRESSION:  1.BASED ON THE ACR LUNG-RADS FOR THE MOST SUSPICIOUS NODULE (IF ANY) . REC 60-MONTH SCREENING LDCT.     2.         STABLE ECTATIC ASC AORTA  4.4 CM.   3.  HEAVY CORONARY ARTERY CALCIFICATION.   4.CIRRHOTIC LIVER MORPHOLOGY.  ROS:  Constitutional - no appetite or weight changes, no fatigue, no fevers, chills, or night sweats  Respiratory - no dyspnea or cough  Cardiovascular - no chest pain or palpitations  Gastrointestinal - no nausea, vomiting, diarrhea, or constipation, no dyspepsia  Endocrine - no heat or cold intolerance, no polyuria,  polydipsia, or polyphagia  Skin - no rashes, color changes, or lesions  Musculoskeletal - no arthralgias or myalgias  Genitourinary - no urinary frequency or dysuria, no genital discharge  Neurologic - no vision or hearing changes, no weakness, no parasthesias   Psychiatric - mood has been appropriate, no depression or anxiety    Patient Active Problem List    Diagnosis Date Noted    Cirrhosis (CMS HCC) 02/19/2023    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) 04/18/2022    Gastritis 04/18/2022    Hiatal hernia 04/18/2022    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    History of colon polyps 04/17/2022    Dyslipidemia 08/05/2021    Tobacco use disorder 08/05/2021    Abnormal cardiovascular stress test 06/09/2007  Past Surgical History:   Procedure Laterality Date    HX TONSILLECTOMY  1959    SURGERY SCROTAL / TESTICULAR  2004    [facility]           Current Outpatient Medications   Medication Sig    aspirin 81 mg Oral Tablet, Chewable Chew 1 Tablet (81 mg total) One time    carvediloL (COREG) 3.125 mg Oral Tablet Take 1 Tablet (3.125 mg total) by mouth Twice daily    cetirizine (ZYRTEC) 10 mg Oral Tablet Take 1 Tablet (10 mg total) by mouth Once a day    cyclobenzaprine (FLEXERIL) 10 mg Oral Tablet TAKE 1 TABLET (10 MG TOTAL) BY MOUTH THREE TIMES A DAY INDICATIONS: MUSCLE SPASM    ENBREL SURECLICK 50 mg/mL (1 mL) Subcutaneous Pen Injector     ergocalciferol, vitamin D2, (DRISDOL) 1,250 mcg (50,000 unit) Oral Capsule Take 1 Capsule (50,000 Units total) by mouth Every 7 days    famotidine (PEPCID) 40 mg Oral Tablet Take 1 Tablet (40 mg total) by mouth Every morning    fluticasone propionate (FLONASE) 50 mcg/actuation Nasal Spray, Suspension Administer 1 Spray into each nostril Once a day    furosemide (LASIX) 40 mg Oral Tablet TAKE 1 TABLET EVERY DAY IN THE MORNING AS NEEDED FOR EDEMA    glipiZIDE (GLUCOTROL  XL) 5 mg Oral Tablet Extended Rel 24 hr TAKE 1 (ONE) TABLET BEFORE DINNER    JARDIANCE 25 mg Oral Tablet Take 1 Tablet (25 mg total) by mouth Once a day    losartan-hydrochlorothiazide (HYZAAR) 50-12.5 mg Oral Tablet Take 1 Tablet by mouth Once a day    MetFORMIN (GLUCOPHAGE) 1,000 mg Oral Tablet Take 1 Tablet (1,000 mg total) by mouth Twice daily    montelukast (SINGULAIR) 10 mg Oral Tablet Take 1 Tablet (10 mg total) by mouth Once a day    omeprazole (PRILOSEC) 40 mg Oral Capsule, Delayed Release(E.C.) TAKE 1 CAPSULE BY MOUTH EVERY DAY BEFORE DINNER    OZEMPIC 2 mg/dose (8 mg/3 mL) Subcutaneous Pen Injector Inject 2 mg under the skin Every 7 days    pioglitazone (ACTOS) 30 mg Oral Tablet Take 1 Tablet (30 mg total) by mouth Once a day    pravastatin (PRAVACHOL) 20 mg Oral Tablet Take 1 Tablet (20 mg total) by mouth    propranoloL (INDERAL) 10 mg Oral Tablet Take 1 Tablet (10 mg total) by mouth Twice daily    tamsulosin (FLOMAX) 0.4 mg Oral Capsule Take 1 Capsule (0.4 mg total) by mouth Every evening after dinner Indications: enlarged prostate with urination problem       Objective:     BP 137/74 (Site: Left Arm, Patient Position: Sitting, Cuff Size: Adult)   Pulse 84   Temp 36.3 C (97.3 F) (Temporal)   Resp 20   Ht 1.854 m (6\' 1" )   Wt 122 kg (268 lb)   SpO2 94%   BMI 35.36 kg/m       BMI addressed: Intervention for BMI inappropriate due to age over 10 and comorbidities.     General appearance: alert, cooperative, in no acute distress  Lungs: clear to auscultation bilaterally; no wheezes or rhonchi   Heart: regular rate and rhythm; normal S1 & S2; no murmur  Abdomen: obese  Extremities: extremities normal ROM, Tr edema , no rash  Psych: alert and oriented x 3  Neuro: CN 2-12 grossly intact  Peripheral motor and sensory exams are grossly normal    Assessment/Plan  Assessment & Plan  Type 2 diabetes mellitus (CMS HCC)  A1C 5.8% 02/25  Glipizide  Jardiance  Metformin  Ozempic  Actos  Orders:    BASIC  METABOLIC PANEL; Future    MICROALBUMIN/CREATININE RATIO, URINE, RANDOM; Future    Hepatic cirrhosis, unspecified hepatic cirrhosis type, unspecified whether ascites present (CMS HCC)  This has been attributed to fatty liver.  He quit drinking many years ago.  He is followed by Dr. Allena Katz about every 6 months.  Coreg  Orders:    HEPATIC FUNCTION PANEL; Future    Severe obesity (BMI 35.0-35.9 with comorbidity) (CMS HCC)  Ozempic 2mg        Encounter for hepatitis C screening test for low risk patient    Orders:    HEPATITIS C ANTIBODY SCREEN WITH REFLEX TO HCV PCR; Future    Leg swelling    Orders:    THYROID STIMULATING HORMONE (SENSITIVE TSH); Future    DOE (dyspnea on exertion)  May need to stop Actos esp since A1C is reported as 5.8%  Orders:    TRANSTHORACIC ECHOCARDIOGRAM - ADULT; Future    EKG (93000) - SAME DAY- Performed in Office  NSR 74 BMP.  IVCD pattern. No prior EKG for comparison.   ASCVD (arteriosclerotic cardiovascular disease)  ASA 81 mg daily  Pravastatin 20 mg LDL 54  2023  Ozempic  Carvedilol  Orders:    Referral to CARDIOLOGY - [facility]; Future    LIPID PANEL; Future               Health Maintenance:                   The patient was given the opportunity to ask questions and those questions were answered to the patient's satisfaction. The patient was encouraged to call with any additional questions or concerns. Instructed patient to call back if symptoms worse.   Discussed with patient effects and side effects of medications. Medication safety was discussed. A copy of the patient's medication list was printed and given to the patient. A good faith effort was made to reconcile the patient's medications.       Follow up: Return in about 2 months (around 08/22/2023).    Magdalene Patricia, MD

## 2023-06-24 NOTE — Assessment & Plan Note (Addendum)
This has been attributed to fatty liver.  He quit drinking many years ago.  He is followed by Dr. Allena Katz about every 6 months.  Coreg  Orders:    HEPATIC FUNCTION PANEL; Future
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
## 2023-06-30 ENCOUNTER — Other Ambulatory Visit: Payer: Self-pay

## 2023-06-30 ENCOUNTER — Telehealth (INDEPENDENT_AMBULATORY_CARE_PROVIDER_SITE_OTHER): Payer: Self-pay | Admitting: Internal Medicine

## 2023-06-30 ENCOUNTER — Ambulatory Visit (INDEPENDENT_AMBULATORY_CARE_PROVIDER_SITE_OTHER): Payer: Managed Care, Other (non HMO)

## 2023-06-30 ENCOUNTER — Other Ambulatory Visit: Payer: Managed Care, Other (non HMO) | Attending: Internal Medicine | Admitting: Internal Medicine

## 2023-06-30 DIAGNOSIS — I2510 Atherosclerotic heart disease of native coronary artery without angina pectoris: Secondary | ICD-10-CM

## 2023-06-30 DIAGNOSIS — Z125 Encounter for screening for malignant neoplasm of prostate: Secondary | ICD-10-CM

## 2023-06-30 DIAGNOSIS — Z1159 Encounter for screening for other viral diseases: Secondary | ICD-10-CM | POA: Insufficient documentation

## 2023-06-30 DIAGNOSIS — K7460 Unspecified cirrhosis of liver: Secondary | ICD-10-CM | POA: Insufficient documentation

## 2023-06-30 DIAGNOSIS — E119 Type 2 diabetes mellitus without complications: Secondary | ICD-10-CM

## 2023-06-30 DIAGNOSIS — M7989 Other specified soft tissue disorders: Secondary | ICD-10-CM | POA: Insufficient documentation

## 2023-06-30 LAB — HEPATIC FUNCTION PANEL
ALBUMIN/GLOBULIN RATIO: 1.2 (ref 0.8–1.4)
ALBUMIN: 4.4 g/dL (ref 3.5–5.7)
ALKALINE PHOSPHATASE: 64 U/L (ref 34–104)
ALT (SGPT): 57 U/L — ABNORMAL HIGH (ref 7–52)
AST (SGOT): 66 U/L — ABNORMAL HIGH (ref 13–39)
BILIRUBIN DIRECT: 0.08 md/dL (ref 0.03–0.18)
BILIRUBIN TOTAL: 0.5 mg/dL (ref 0.3–1.0)
BILIRUBIN, INDIRECT: 0.42 mg/dL (ref <=1)
GLOBULIN: 3.8 — ABNORMAL HIGH (ref 2.0–3.5)
PROTEIN TOTAL: 8.2 g/dL (ref 6.4–8.9)

## 2023-06-30 LAB — LIPID PANEL
CHOL/HDL RATIO: 3.4
CHOLESTEROL: 123 mg/dL (ref <200)
HDL CHOL: 36 mg/dL — ABNORMAL LOW (ref >=40)
LDL CALC: 39 mg/dL (ref 0–100)
TRIGLYCERIDES: 239 mg/dL — ABNORMAL HIGH (ref <=150)
VLDL CALC: 48 mg/dL (ref 0–50)

## 2023-06-30 LAB — BASIC METABOLIC PANEL
ANION GAP: 7 mmol/L (ref 4–13)
BUN/CREA RATIO: 17 (ref 6–22)
BUN: 13 mg/dL (ref 7–25)
CALCIUM: 10.3 mg/dL (ref 8.6–10.3)
CHLORIDE: 104 mmol/L (ref 98–107)
CO2 TOTAL: 27 mmol/L (ref 21–31)
CREATININE: 0.77 mg/dL (ref 0.60–1.30)
ESTIMATED GFR: 96 mL/min/{1.73_m2} (ref >59)
GLUCOSE: 136 mg/dL — ABNORMAL HIGH (ref 74–109)
OSMOLALITY, CALCULATED: 278 mosm/kg (ref 270–290)
POTASSIUM: 4.5 mmol/L (ref 3.5–5.1)
SODIUM: 138 mmol/L (ref 136–145)

## 2023-06-30 LAB — PSA SCREENING: PSA: 0.15 ng/mL (ref <=4.00)

## 2023-06-30 LAB — THYROID STIMULATING HORMONE (SENSITIVE TSH): TSH: 1.922 u[IU]/mL (ref 0.450–5.330)

## 2023-06-30 LAB — MICROALBUMIN/CREATININE RATIO, URINE, RANDOM
CREATININE RANDOM URINE: 53 mg/dL (ref 30–125)
MICROALBUMIN RANDOM URINE: 0.8 mg/dL
MICROALBUMIN/CREATININE RATIO RANDOM URINE: 15.1 mg/g

## 2023-06-30 NOTE — Telephone Encounter (Signed)
-----   Message from Magdalene Patricia, MD sent at 06/30/2023 10:00 AM EST -----  Regarding: FW: Pravastatin  Please remind him to come in for labs this week if possible. He and wife- labs are ordered.  ----- Message -----  From: Magdalene Patricia, MD  Sent: 06/24/2023  10:35 AM EST  To: Magdalene Patricia, MD  Subject: Pravastatin                                      May need to change statin after he gets next lipids.

## 2023-06-30 NOTE — Telephone Encounter (Signed)
 Vmail left to return call.

## 2023-07-01 ENCOUNTER — Ambulatory Visit
Admission: RE | Admit: 2023-07-01 | Discharge: 2023-07-01 | Disposition: A | Discharge status: Home / Self Care | Payer: Managed Care, Other (non HMO) | Source: Ambulatory Visit | Attending: Internal Medicine | Admitting: Internal Medicine

## 2023-07-01 DIAGNOSIS — R0609 Other forms of dyspnea: Secondary | ICD-10-CM | POA: Insufficient documentation

## 2023-07-01 LAB — TRANSTHORACIC ECHOCARDIOGRAM - ADULT: EF: 65

## 2023-07-01 NOTE — Telephone Encounter (Signed)
Spoke with patient, states they came by yesterday.

## 2023-07-02 ENCOUNTER — Ambulatory Visit (INDEPENDENT_AMBULATORY_CARE_PROVIDER_SITE_OTHER): Payer: Self-pay | Admitting: Internal Medicine

## 2023-07-02 DIAGNOSIS — K7460 Unspecified cirrhosis of liver: Secondary | ICD-10-CM

## 2023-07-02 LAB — HEPATITIS C ANTIBODY SCREEN WITH REFLEX TO HCV PCR: HCV ANTIBODY QUALITATIVE: NEGATIVE

## 2023-07-02 NOTE — Result Encounter Note (Signed)
Let pt know that the echocardiogram showed good heart function.    His labs were okay.  He should speak to Dr. Bridget Hartshorn about coming off of the ACTOS to see if that helps with his swelling and SOB.  With an A1C of 5.8% I dont think he needs so many diabetic meds anyway.

## 2023-07-14 ENCOUNTER — Other Ambulatory Visit (HOSPITAL_COMMUNITY): Payer: Self-pay | Admitting: Family

## 2023-07-14 DIAGNOSIS — K7460 Unspecified cirrhosis of liver: Secondary | ICD-10-CM

## 2023-07-15 ENCOUNTER — Ambulatory Visit: Attending: Family

## 2023-07-15 ENCOUNTER — Other Ambulatory Visit: Payer: Self-pay

## 2023-07-15 DIAGNOSIS — R935 Abnormal findings on diagnostic imaging of other abdominal regions, including retroperitoneum: Secondary | ICD-10-CM | POA: Insufficient documentation

## 2023-07-15 LAB — CBC
HCT: 42.3 % (ref 36.7–47.1)
HGB: 14.1 g/dL (ref 12.5–16.3)
MCH: 30.4 pg (ref 23.8–33.4)
MCHC: 33.4 g/dL (ref 32.5–36.3)
MCV: 91.1 fL (ref 73.0–96.2)
MPV: 9.5 fL (ref 7.4–11.4)
PLATELETS: 106 10*3/uL — ABNORMAL LOW (ref 140–440)
RBC: 4.64 10*6/uL (ref 4.06–5.63)
RDW: 14.7 % (ref 12.1–16.2)
WBC: 3.3 10*3/uL — ABNORMAL LOW (ref 3.6–10.2)

## 2023-07-15 LAB — HEPATIC FUNCTION PANEL
ALBUMIN/GLOBULIN RATIO: 1.2 (ref 0.8–1.4)
ALBUMIN: 4.4 g/dL (ref 3.5–5.7)
ALKALINE PHOSPHATASE: 74 U/L (ref 34–104)
ALT (SGPT): 78 U/L — ABNORMAL HIGH (ref 7–52)
AST (SGOT): 93 U/L — ABNORMAL HIGH (ref 13–39)
BILIRUBIN DIRECT: 0.15 md/dL (ref 0.03–0.18)
BILIRUBIN TOTAL: 0.5 mg/dL (ref 0.3–1.0)
BILIRUBIN, INDIRECT: 0.35 mg/dL (ref <=1)
GLOBULIN: 3.6 — ABNORMAL HIGH (ref 2.0–3.5)
PROTEIN TOTAL: 8 g/dL (ref 6.4–8.9)

## 2023-07-17 LAB — ALPHA FETOPROTEIN (AFP) TUMOR MARKER: AFP TUMOR MARKER: 6 ng/mL (ref <=9)

## 2023-07-18 ENCOUNTER — Other Ambulatory Visit (INDEPENDENT_AMBULATORY_CARE_PROVIDER_SITE_OTHER): Payer: Self-pay | Admitting: Internal Medicine

## 2023-07-18 DIAGNOSIS — I10 Essential (primary) hypertension: Secondary | ICD-10-CM

## 2023-07-27 ENCOUNTER — Other Ambulatory Visit: Payer: Self-pay

## 2023-07-27 ENCOUNTER — Ambulatory Visit (INDEPENDENT_AMBULATORY_CARE_PROVIDER_SITE_OTHER): Payer: Self-pay | Admitting: Specialist

## 2023-07-27 ENCOUNTER — Encounter (INDEPENDENT_AMBULATORY_CARE_PROVIDER_SITE_OTHER): Payer: Self-pay | Admitting: Specialist

## 2023-07-27 VITALS — BP 134/73 | HR 87 | Wt 265.0 lb

## 2023-07-27 DIAGNOSIS — I2510 Atherosclerotic heart disease of native coronary artery without angina pectoris: Secondary | ICD-10-CM

## 2023-07-27 DIAGNOSIS — E785 Hyperlipidemia, unspecified: Secondary | ICD-10-CM

## 2023-07-27 NOTE — Progress Notes (Signed)
 CARDIOLOGY, MEDICAL ARTS BUILDING  [address] [place] [address]       Cardiology Progress Note    Name: Miguel Hendricks MRN:  [id number]   Date: 07/27/2023 Age: 73 y.o.     Chief Compliant:  Chief Complaint              New Patient Referred for CDM            HPI:  This 73 year old male referred by Dr. Gaynell Face for evaluation of coronary artery disease.  The patient admits to increasing shortness of breath over the past year to the point that he got reasonably short of breath just coming in to the office today.  He denies any specific chest pain but he has had several tests including an echocardiogram 65%.  No valvular disease was evident.  Had an EKG demonstrating possible infarction.  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  He does have a history of diabetes and hyperlipidemia and has been on long-term statin therapy for same.  Family history is positive for pacemaker in 1 of his relatives but no specific myocardial infarction.  He has been noted to have coronary artery calcifications in the past.  Present cardiac medications include aspirin Coreg Lasix Jardiance Pravachol.  On exam he presents as a well-developed white male who is somewhat overweight the blood pressure 134/73 there is no JVD no carotid bruit.  Cardiac-wise PMI normal location there is no obvious murmur.        Past Medical History  Past Medical History:   Diagnosis Date    Allergic rhinitis     Diabetes mellitus (CMS HCC)     DNS (deviated nasal septum)     Esophageal reflux     Essential hypertension     Hx of skin malignancy     Impacted cerumen of both ears      Past Surgical History:   Procedure Laterality Date    HX TONSILLECTOMY  1959    SURGERY SCROTAL / TESTICULAR  2004    [facility]      Family Medical History:    None        Social History     Tobacco Use   Smoking Status Former    Current packs/day: 0.00    Average packs/day: 1 pack/day for 50.0 years (50.0 ttl pk-yrs)    Types: Cigarettes    Start date: 68     Quit date: 2021    Years since quitting: 4.2   Smokeless Tobacco Never   Tobacco Comments    Counseled by Ddr. Gaynell Face on 10/20/22      Current Outpatient Medications   Medication Sig    aspirin 81 mg Oral Tablet, Chewable Chew 1 Tablet (81 mg total) One time    carvediloL (COREG) 3.125 mg Oral Tablet Take 1 Tablet (3.125 mg total) by mouth Twice daily    cetirizine (ZYRTEC) 10 mg Oral Tablet Take 1 Tablet (10 mg total) by mouth Once a day    cyclobenzaprine (FLEXERIL) 10 mg Oral Tablet TAKE 1 TABLET (10 MG TOTAL) BY MOUTH THREE TIMES A DAY INDICATIONS: MUSCLE SPASM    ENBREL SURECLICK 50 mg/mL (1 mL) Subcutaneous Pen Injector     ergocalciferol, vitamin D2, (DRISDOL) 1,250 mcg (50,000 unit) Oral Capsule Take 1 Capsule (50,000 Units total) by mouth Every 7 days    famotidine (PEPCID) 40 mg Oral Tablet Take 1 Tablet (40 mg total) by mouth Every  morning    fluticasone propionate (FLONASE) 50 mcg/actuation Nasal Spray, Suspension Administer 1 Spray into each nostril Once a day    furosemide (LASIX) 40 mg Oral Tablet TAKE 1 TABLET EVERY DAY IN THE MORNING AS NEEDED FOR EDEMA    glipiZIDE (GLUCOTROL XL) 5 mg Oral Tablet Extended Rel 24 hr TAKE 1 (ONE) TABLET BEFORE DINNER    JARDIANCE 25 mg Oral Tablet Take 1 Tablet (25 mg total) by mouth Daily    losartan-hydrochlorothiazide (HYZAAR) 50-12.5 mg Oral Tablet Take 1 Tablet by mouth Once a day    MetFORMIN (GLUCOPHAGE) 1,000 mg Oral Tablet Take 1 Tablet (1,000 mg total) by mouth Twice daily    montelukast (SINGULAIR) 10 mg Oral Tablet Take 1 Tablet (10 mg total) by mouth Once a day    omeprazole (PRILOSEC) 40 mg Oral Capsule, Delayed Release(E.C.) TAKE 1 CAPSULE BY MOUTH EVERY DAY BEFORE DINNER    OZEMPIC 2 mg/dose (8 mg/3 mL) Subcutaneous Pen Injector Inject 2 mg under the skin Every 7 days    pioglitazone (ACTOS) 30 mg Oral Tablet Take 1 Tablet (30 mg total) by mouth Daily    pravastatin (PRAVACHOL) 20 mg Oral Tablet Take 1 Tablet (20 mg total) by mouth    tamsulosin  (FLOMAX) 0.4 mg Oral Capsule TAKE 1 CAPSULE BY MOUTH EVERY DAY IN THE EVENING AFTER DINNER     No Known Allergies    Review of Systems:  A focused review of systems was completed as described in the HPI.    BP 134/73   Pulse 87   Wt 120 kg (265 lb)   SpO2 92%   BMI 34.96 kg/m       See cardiac examination above    Assessment and Plan:    ICD-10-CM    1. ASCVD (arteriosclerotic cardiovascular disease)  I25.10          See additional plan details above:  He appears to have several risk factors for coronary artery disease and will need a treadmill along with Cardiolite.  He will return when his studies are completed    Karie Fetch, MD

## 2023-08-17 ENCOUNTER — Other Ambulatory Visit: Payer: Self-pay

## 2023-08-17 ENCOUNTER — Encounter (INDEPENDENT_AMBULATORY_CARE_PROVIDER_SITE_OTHER): Payer: Self-pay | Admitting: OTOLARYNGOLOGY

## 2023-08-17 ENCOUNTER — Ambulatory Visit: Payer: Self-pay | Attending: OTOLARYNGOLOGY | Admitting: OTOLARYNGOLOGY

## 2023-08-17 VITALS — Ht 73.0 in | Wt 265.0 lb

## 2023-08-17 DIAGNOSIS — J342 Deviated nasal septum: Secondary | ICD-10-CM | POA: Insufficient documentation

## 2023-08-17 DIAGNOSIS — J310 Chronic rhinitis: Secondary | ICD-10-CM | POA: Insufficient documentation

## 2023-08-17 DIAGNOSIS — H7291 Unspecified perforation of tympanic membrane, right ear: Secondary | ICD-10-CM | POA: Insufficient documentation

## 2023-08-17 DIAGNOSIS — H6123 Impacted cerumen, bilateral: Secondary | ICD-10-CM

## 2023-08-17 DIAGNOSIS — J309 Allergic rhinitis, unspecified: Secondary | ICD-10-CM | POA: Insufficient documentation

## 2023-08-17 DIAGNOSIS — H6993 Unspecified Eustachian tube disorder, bilateral: Secondary | ICD-10-CM | POA: Insufficient documentation

## 2023-08-17 DIAGNOSIS — H6120 Impacted cerumen, unspecified ear: Secondary | ICD-10-CM | POA: Insufficient documentation

## 2023-08-17 MED ORDER — MONTELUKAST 10 MG TABLET
10.0000 mg | ORAL_TABLET | Freq: Every day | ORAL | 3 refills | Status: DC
Start: 2023-08-17 — End: 2024-02-22

## 2023-08-17 MED ORDER — CETIRIZINE 10 MG TABLET
10.0000 mg | ORAL_TABLET | Freq: Every day | ORAL | 3 refills | Status: DC
Start: 2023-08-17 — End: 2024-02-22

## 2023-08-17 NOTE — H&P (Addendum)
 [facility]  [address]  [place] [address]  Operated by [facility]  Return Patient Visit    Name: Miguel Hendricks MRN:  [id number]   Date: 08/17/2023 DOB: 1950-05-16 (72 y.o.)       Referring Provider:  Magdalene Patricia, MD    Reason for Visit:   Chief Complaint   Patient presents with    Follow Up 6 Months     6 month rc on ears and sinuses.        History of Present Illness:  Miguel Hendricks is a 73 y.o. male who is FU on AR and chronic AD TM perf. Taking zyrtec, Flonase and Singulair, and working well. No complaints.     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)    Gastritis    Hiatal hernia    Cirrhosis     Current Outpatient Medications   Medication Sig    aspirin 81 mg Oral Tablet, Chewable Chew 1 Tablet (81 mg total) One time    carvediloL (COREG) 3.125 mg Oral Tablet Take 1 Tablet (3.125 mg total) by mouth Twice daily    cetirizine (ZYRTEC) 10 mg Oral Tablet Take 1 Tablet (10 mg total) by mouth Daily    cyclobenzaprine (FLEXERIL) 10 mg Oral Tablet TAKE 1 TABLET (10 MG TOTAL) BY MOUTH THREE TIMES A DAY INDICATIONS: MUSCLE SPASM    ENBREL SURECLICK 50 mg/mL (1 mL) Subcutaneous Pen Injector     ergocalciferol, vitamin D2, (DRISDOL) 1,250 mcg (50,000 unit) Oral Capsule Take 1 Capsule (50,000 Units total) by mouth Every 7 days    famotidine (PEPCID) 40 mg Oral Tablet Take 1 Tablet (40 mg total) by mouth Every morning    fluticasone propionate (FLONASE) 50 mcg/actuation Nasal Spray, Suspension Administer 1 Spray into each nostril Once a day    furosemide (LASIX) 40 mg Oral Tablet TAKE 1 TABLET EVERY DAY IN THE MORNING AS NEEDED FOR EDEMA    glipiZIDE (GLUCOTROL XL) 5 mg Oral Tablet Extended  Rel 24 hr TAKE 1 (ONE) TABLET BEFORE DINNER    JARDIANCE 25 mg Oral Tablet Take 1 Tablet (25 mg total) by mouth Daily    losartan-hydrochlorothiazide (HYZAAR) 50-12.5 mg Oral Tablet Take 1 Tablet by mouth Once a day    MetFORMIN (GLUCOPHAGE) 1,000 mg Oral Tablet Take 1 Tablet (1,000 mg total) by mouth Twice daily    montelukast (SINGULAIR) 10 mg Oral Tablet Take 1 Tablet (10 mg total) by mouth Daily    omeprazole (PRILOSEC) 40 mg Oral Capsule, Delayed Release(E.C.) TAKE 1 CAPSULE BY MOUTH EVERY DAY BEFORE DINNER    OZEMPIC 2 mg/dose (8 mg/3 mL) Subcutaneous Pen Injector Inject 2 mg under the skin Every 7 days    pioglitazone (ACTOS) 30 mg Oral Tablet Take 1 Tablet (30 mg total) by mouth Daily    pravastatin (PRAVACHOL) 20 mg Oral Tablet Take 1 Tablet (20 mg total) by mouth    tamsulosin (FLOMAX) 0.4 mg Oral Capsule TAKE 1 CAPSULE BY MOUTH EVERY DAY IN THE EVENING AFTER DINNER      No Known Allergies  Past Medical History:   Diagnosis Date  Allergic rhinitis     Diabetes mellitus     DNS (deviated nasal septum)     Esophageal reflux     Essential hypertension     Hx of skin malignancy     Impacted cerumen of both ears      Past Surgical History:   Procedure Laterality Date    HX TONSILLECTOMY  1959    SURGERY SCROTAL / TESTICULAR  2004    [facility]     Family Medical History:    None         Social History     Tobacco Use    Smoking status: Former     Current packs/day: 0.00     Average packs/day: 1 pack/day for 50.0 years (50.0 ttl pk-yrs)     Types: Cigarettes     Start date: 29     Quit date: 2021     Years since quitting: 4.2    Smokeless tobacco: Never    Tobacco comments:     Counseled by Ddr. Gaynell Face on 10/20/22   Vaping Use    Vaping status: Never Used   Substance Use Topics    Alcohol use: Not Currently    Drug use: Not Currently       Review of Systems:  Review of Systems    Physical Exam:  Ht 1.854 m (6\' 1" )   Wt 120 kg (265 lb)   BMI 34.96 kg/m       ENT Physical Exam  Constitutional  Appearance:  patient appears well-developed, well-nourished and well-groomed,  Communication/Voice: communication appropriate for developmental age; vocal quality normal;  Head and Face  Appearance: head appears normal, face appears normal and face appears atraumatic;  Palpation: facial palpation normal;  Salivary: glands normal;  Ear  Hearing: intact;  Auricles: right auricle normal; left auricle normal;  External Mastoids: right external mastoid normal; left external mastoid normal;  Ear Canals: bilateral ear canals impacted cerumen observed;  Tympanic Membranes: left tympanic membrane normal;  Ear comments: 40% perforation dry  Nose  External Nose: nares patent bilaterally; external nose normal;  Internal Nose: nasal mucosa normal; septum normal; bilateral inferior turbinates normal;  Oral Cavity/Oropharynx  Lips: normal;  Teeth: normal;  Gums: gingiva normal;  Tongue: normal;  Oral mucosa: normal;  Hard palate: normal;  Soft palate: normal;  Tonsils: normal;  Base of Tongue: normal;  Posterior pharyngeal wall: normal;  Neck  Neck: neck normal; neck palpation normal;  Thyroid: thyroid normal;  Respiratory  Inspection: breathing unlabored; normal breathing rate;  Lymphatic  Palpation: lymph nodes normal;  Neurovestibular  Mental Status: alert and oriented;  Psychiatric: mood normal; affect is appropriate;  Cranial Nerves: cranial nerves intact;       Assessment:  ENCOUNTER DIAGNOSES     ICD-10-CM   1. ETD (Eustachian tube dysfunction), bilateral  H69.93   2. Nasal septal deviation  J34.2   3. Chronic rhinitis  J31.0   4. Allergic rhinitis, unspecified seasonality, unspecified trigger  J30.9   5. Tympanic membrane perforation, right  H72.91   6. Impacted cerumen, unspecified laterality  H61.20       Plan:  Medical records reviewed on 08/17/2023.  AU cerumen debrided. Refilled zyrtec and Singulair. Right perforation is stable.    Orders Placed This Encounter    [phone] - REMOVAL IMPACTED CERUMEN W/ INSTRUMENT, UNILATERAL (AMB  ONLY-PD)    POCT HEARING/VISION/TYMPANOGRAM (AMB ONLY)    cetirizine (ZYRTEC) 10 mg Oral Tablet    montelukast (SINGULAIR) 10 mg  Oral Tablet     Return in about 6 months (around 02/16/2024).    Marcelline Deist, PA-C  The advanced practice clinician's documentation was reviewed/amended in its entirety with the assessment and plan portion completely performed independently by me during this separate encounter.

## 2023-08-17 NOTE — Procedures (Signed)
 [facility]  [address]  [place] [address]  Operated by [facility]  Procedure Note    Name: [name] MRN:  [id number]   Date: 08/17/2023 DOB:  [birth year] (73 y.o.)         [phone] - REMOVAL IMPACTED CERUMEN W/ INSTRUMENT, UNILATERAL (AMB ONLY-PD)    Performed by: Conchita Paris, DO  Authorized by: Conchita Paris, DO    Time Out:     Immediately before the procedure, a time out was called:  Yes    Patient verified:  Yes    Procedure Verified:  Yes    Site Verified:  Yes  Documentation:      Procedure: Cerumen cleaning  Pre-op Dx: Cerumen impaction      Bilateral EAC(s) examined under binocular microscopy.  Cerumen and/or debris was cleaned from the canal(s) using curettes, suction, and alligator forceps.  Patient tolerated procedure well.        Conchita Paris, DO

## 2023-08-18 ENCOUNTER — Other Ambulatory Visit: Payer: Self-pay

## 2023-08-19 ENCOUNTER — Ambulatory Visit (HOSPITAL_COMMUNITY)
Admission: RE | Admit: 2023-08-19 | Discharge: 2023-08-19 | Disposition: A | Discharge status: Home / Self Care | Payer: Self-pay | Source: Ambulatory Visit

## 2023-08-19 ENCOUNTER — Inpatient Hospital Stay (HOSPITAL_BASED_OUTPATIENT_CLINIC_OR_DEPARTMENT_OTHER)
Admission: RE | Admit: 2023-08-19 | Discharge: 2023-08-19 | Disposition: A | Discharge status: Home / Self Care | Payer: Self-pay | Source: Ambulatory Visit

## 2023-08-19 ENCOUNTER — Ambulatory Visit
Admission: RE | Admit: 2023-08-19 | Discharge: 2023-08-19 | Disposition: A | Discharge status: Home / Self Care | Payer: Self-pay | Source: Ambulatory Visit | Attending: Specialist | Admitting: Specialist

## 2023-08-19 DIAGNOSIS — I2510 Atherosclerotic heart disease of native coronary artery without angina pectoris: Secondary | ICD-10-CM | POA: Insufficient documentation

## 2023-08-19 MED ORDER — REGADENOSON 0.4 MG/5 ML INTRAVENOUS SYRINGE
0.4000 mg | INJECTION | Freq: Once | INTRAVENOUS | Status: AC
Start: 2023-08-19 — End: 2023-08-19
  Administered 2023-08-19: 0.4 mg via INTRAVENOUS
  Filled 2023-08-19: qty 5

## 2023-08-19 NOTE — Ancillary Notes (Signed)
 Patient was initially scheduled for cardiolyte stress test. This was to be done by treadmill. Once the patient arrived, he stated that he had arthritis in both knees. Dr. Ernestina Patches made aware and stress test changed to lexiscan.

## 2023-08-25 ENCOUNTER — Encounter (INDEPENDENT_AMBULATORY_CARE_PROVIDER_SITE_OTHER): Payer: Self-pay | Admitting: Internal Medicine

## 2023-08-25 DIAGNOSIS — I2510 Atherosclerotic heart disease of native coronary artery without angina pectoris: Secondary | ICD-10-CM

## 2023-08-26 ENCOUNTER — Other Ambulatory Visit: Payer: Self-pay

## 2023-08-26 ENCOUNTER — Encounter (INDEPENDENT_AMBULATORY_CARE_PROVIDER_SITE_OTHER): Payer: Self-pay | Admitting: Specialist

## 2023-08-26 ENCOUNTER — Ambulatory Visit (INDEPENDENT_AMBULATORY_CARE_PROVIDER_SITE_OTHER): Payer: Self-pay | Admitting: Specialist

## 2023-08-26 ENCOUNTER — Other Ambulatory Visit (INDEPENDENT_AMBULATORY_CARE_PROVIDER_SITE_OTHER): Payer: Self-pay | Admitting: Internal Medicine

## 2023-08-26 ENCOUNTER — Other Ambulatory Visit (INDEPENDENT_AMBULATORY_CARE_PROVIDER_SITE_OTHER): Payer: Self-pay | Admitting: Specialist

## 2023-08-26 VITALS — BP 149/64 | HR 91 | Wt 267.6 lb

## 2023-08-26 DIAGNOSIS — I2510 Atherosclerotic heart disease of native coronary artery without angina pectoris: Secondary | ICD-10-CM

## 2023-08-26 DIAGNOSIS — R0609 Other forms of dyspnea: Secondary | ICD-10-CM

## 2023-08-26 MED ORDER — FUROSEMIDE 40 MG TABLET
40.0000 mg | ORAL_TABLET | Freq: Every day | ORAL | 0 refills | Status: DC | PRN
Start: 2023-08-26 — End: 2023-12-02

## 2023-08-26 NOTE — Addendum Note (Signed)
 Addended byArland Bellis on: 08/26/2023 10:20 AM     Modules accepted: Orders

## 2023-08-26 NOTE — Addendum Note (Signed)
 Addended by: Eleanor Greulich on: 08/26/2023 10:28 AM     Modules accepted: Orders

## 2023-08-26 NOTE — Progress Notes (Addendum)
 [facility]  [address] [place] [address]       Cardiology Progress Note    Name: Miguel Hendricks MRN:  [id number]   Date: 08/26/2023 Age: 73 y.o.     Chief Compliant:  Chief Complaint              Follow-up After Testing Cardiolite            HPI:  This 73 year old white male returns in follow-up.  He was referred by Dr. Charon Copper for evaluation of possible coronary artery disease.  His main problem seems to be increasing shortness of breath over the past year to the point that he could barely walk into the office today.  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  He denies any chest pain or palpitations.  He does have history of diabetes hyperlipidemia.  Family history is positive for pacemaker in 1 of his relatives.  He has undergone a CT scan showing some coronary calcification.Aaron Aas  He relates that he was catheterized some 20 years ago in [place] with the parent no disease found.  He underwent a [place] study year old which showed some diminished activity through the inferior wall it is unchanged after Lexiscan and rest.  The ejection fraction was surprisingly good at 64%.  On exam today he appears in no acute distress the blood pressure  There is no JVD.  Cardiac-wise she is in a regular rhythm there is no murmur.     Past Medical History  Past Medical History:   Diagnosis Date    Allergic rhinitis     Diabetes mellitus     DNS (deviated nasal septum)     Esophageal reflux     Essential hypertension     Hx of skin malignancy     Impacted cerumen of both ears      Past Surgical History:   Procedure Laterality Date    HX TONSILLECTOMY  1959    SURGERY SCROTAL / TESTICULAR  2004    [facility]      Family Medical History:    None        Social History     Tobacco Use   Smoking Status Former    Current packs/day: 0.00    Average packs/day: 1 pack/day for 50.0 years (50.0 ttl pk-yrs)    Types: Cigarettes    Start date: 51    Quit date: 2021    Years since quitting:  4.2   Smokeless Tobacco Never   Tobacco Comments    Counseled by Ddr. Charon Copper on 10/20/22      Current Outpatient Medications   Medication Sig    aspirin 81 mg Oral Tablet, Chewable Chew 1 Tablet (81 mg total) One time    carvediloL (COREG) 3.125 mg Oral Tablet Take 1 Tablet (3.125 mg total) by mouth Twice daily    cetirizine (ZYRTEC) 10 mg Oral Tablet Take 1 Tablet (10 mg total) by mouth Daily    cyclobenzaprine (FLEXERIL) 10 mg Oral Tablet TAKE 1 TABLET (10 MG TOTAL) BY MOUTH THREE TIMES A DAY INDICATIONS: MUSCLE SPASM    ENBREL SURECLICK 50 mg/mL (1 mL) Subcutaneous Pen Injector     ergocalciferol, vitamin D2, (DRISDOL) 1,250 mcg (50,000 unit) Oral Capsule Take 1 Capsule (50,000 Units total) by mouth Every 7 days    famotidine (PEPCID) 40 mg Oral Tablet Take 1 Tablet (40 mg total) by mouth Every morning    fluticasone propionate (  FLONASE) 50 mcg/actuation Nasal Spray, Suspension Administer 1 Spray into each nostril Once a day    furosemide (LASIX) 40 mg Oral Tablet TAKE 1 TABLET EVERY DAY IN THE MORNING AS NEEDED FOR EDEMA    glipiZIDE (GLUCOTROL XL) 5 mg Oral Tablet Extended Rel 24 hr TAKE 1 (ONE) TABLET BEFORE DINNER    JARDIANCE 25 mg Oral Tablet Take 1 Tablet (25 mg total) by mouth Daily    losartan-hydrochlorothiazide (HYZAAR) 50-12.5 mg Oral Tablet Take 1 Tablet by mouth Once a day    MetFORMIN (GLUCOPHAGE) 1,000 mg Oral Tablet Take 1 Tablet (1,000 mg total) by mouth Twice daily    montelukast (SINGULAIR) 10 mg Oral Tablet Take 1 Tablet (10 mg total) by mouth Daily    omeprazole (PRILOSEC) 40 mg Oral Capsule, Delayed Release(E.C.) TAKE 1 CAPSULE BY MOUTH EVERY DAY BEFORE DINNER    OZEMPIC 2 mg/dose (8 mg/3 mL) Subcutaneous Pen Injector Inject 2 mg under the skin Every 7 days    pioglitazone (ACTOS) 30 mg Oral Tablet Take 1 Tablet (30 mg total) by mouth Daily    pravastatin (PRAVACHOL) 20 mg Oral Tablet Take 1 Tablet (20 mg total) by mouth    tamsulosin (FLOMAX) 0.4 mg Oral Capsule TAKE 1 CAPSULE BY MOUTH  EVERY DAY IN THE EVENING AFTER DINNER     No Known Allergies    Review of Systems:  A focused review of systems was completed as described in the HPI.    BP (!) 149/64   Pulse 91   Wt 121 kg (267 lb 9.6 oz)   SpO2 93%   BMI 35.31 kg/m       See cardiac examination above    Assessment and Plan: Had a discussion at length with him regarding possible catheterization and he is agreeable to same in [place] he is to continue on his medications at the present time will return in follow-up after he has had a study in [place]    See additional plan details above    Karie Fetch, MD

## 2023-08-31 ENCOUNTER — Encounter (INDEPENDENT_AMBULATORY_CARE_PROVIDER_SITE_OTHER): Payer: Self-pay | Admitting: Internal Medicine

## 2023-08-31 ENCOUNTER — Other Ambulatory Visit: Payer: Self-pay

## 2023-08-31 ENCOUNTER — Ambulatory Visit: Payer: Managed Care, Other (non HMO) | Attending: Internal Medicine | Admitting: Internal Medicine

## 2023-08-31 VITALS — BP 119/59 | HR 84 | Temp 97.9°F | Resp 20 | Ht 73.0 in | Wt 268.0 lb

## 2023-08-31 DIAGNOSIS — Z7985 Long-term (current) use of injectable non-insulin antidiabetic drugs: Secondary | ICD-10-CM | POA: Insufficient documentation

## 2023-08-31 DIAGNOSIS — R0609 Other forms of dyspnea: Secondary | ICD-10-CM | POA: Insufficient documentation

## 2023-08-31 DIAGNOSIS — E119 Type 2 diabetes mellitus without complications: Secondary | ICD-10-CM | POA: Insufficient documentation

## 2023-08-31 DIAGNOSIS — K7460 Unspecified cirrhosis of liver: Secondary | ICD-10-CM | POA: Insufficient documentation

## 2023-08-31 DIAGNOSIS — Z7984 Long term (current) use of oral hypoglycemic drugs: Secondary | ICD-10-CM | POA: Insufficient documentation

## 2023-08-31 DIAGNOSIS — R9439 Abnormal result of other cardiovascular function study: Secondary | ICD-10-CM | POA: Insufficient documentation

## 2023-08-31 MED ORDER — MOUNJARO 5 MG/0.5 ML SUBCUTANEOUS PEN INJECTOR
5.0000 mg | PEN_INJECTOR | SUBCUTANEOUS | 0 refills | Status: DC
Start: 2023-08-31 — End: 2023-10-26

## 2023-08-31 NOTE — Assessment & Plan Note (Addendum)
 ALT 57  AST 66   BILI WNL  AFP 6  HCV neg  PLTs 106  Upcoming RUQ US     He may feel fatigue based on cirrhosis

## 2023-08-31 NOTE — Progress Notes (Signed)
 [facility]  [address]  [place] [address]  Operated by [facility]    Miguel Hendricks  March 11, 1951  [id number]    Date of Service: 08/31/2023  2:45 PM EDT    Chief complaint:   Chief Complaint   Patient presents with    Follow Up     Patient is here for CDM. Patient states that he had a stress test and he will likely need a cardiac cath.He states this was ordered by Dr.Prescott. He wishes to discuss Ozempic alternative. He reports his fasting blood sugar this morning was 210.0       Subjective:     This is a case of a 73 y.o. year old male who comes in today for CDM.    Per Dr. Victorino Grates note:  Honora Lutes 04/25:  ad a discussion at length with him regarding possible catheterization and he is agreeable to same in [facility] he is to continue on his medications at the present time will return in follow-up after he has had a study in [facility] Readings from Last 6 Encounters:   08/31/23 122 kg (268 lb)   08/26/23 121 kg (267 lb 9.6 oz)   08/17/23 120 kg (265 lb)   07/27/23 120 kg (265 lb)   06/24/23 122 kg (268 lb)   04/24/23 115 kg (253 lb)         Recent lab results reviewed   A1C 5.8%    He has contacted a cardiologist at [facility].      He notes that his legs seem less swollen off of Actos but his breathing is no better.     ROS:  Constitutional - no appetite or weight changes, no fatigue, no fevers, chills, or night sweats  Respiratory - + dyspnea or cough  Cardiovascular - no chest pain or palpitations  Gastrointestinal - no nausea, vomiting, diarrhea, or constipation, no dyspepsia  Endocrine - no heat or cold intolerance, no polyuria, polydipsia, or polyphagia  Skin - no rashes, color changes, or lesions  Musculoskeletal - no arthralgias or myalgias  Genitourinary - no urinary frequency or dysuria, no genital discharge  Neurologic - no vision or hearing changes, no weakness, no parasthesias   Psychiatric - mood has been appropriate, no depression or  anxiety    Patient Active Problem List    Diagnosis Date Noted    Cirrhosis 02/19/2023    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) 04/18/2022    Gastritis 04/18/2022    Hiatal hernia 04/18/2022    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    History of colon polyps 04/17/2022    Dyslipidemia 08/05/2021    Tobacco use disorder 08/05/2021    Abnormal cardiovascular stress test 06/09/2007       Past Surgical History:   Procedure Laterality Date    HX TONSILLECTOMY  1959    SURGERY SCROTAL / TESTICULAR  2004    [facility]           Current Outpatient Medications   Medication Sig    aspirin 81 mg Oral Tablet, Chewable Chew 1 Tablet (81 mg total) One time    carvediloL (COREG) 3.125 mg Oral Tablet Take 1 Tablet (3.125 mg total) by mouth Twice daily    cetirizine  (ZYRTEC ) 10 mg Oral Tablet Take 1 Tablet (10 mg total) by mouth Daily    cyclobenzaprine  (FLEXERIL ) 10  mg Oral Tablet TAKE 1 TABLET (10 MG TOTAL) BY MOUTH THREE TIMES A DAY INDICATIONS: MUSCLE SPASM    ENBREL SURECLICK 50 mg/mL (1 mL) Subcutaneous Pen Injector Inject 1 mL (50 mg total) under the skin Every 7 days    ergocalciferol, vitamin D2, (DRISDOL) 1,250 mcg (50,000 unit) Oral Capsule Take 1 Capsule (50,000 Units total) by mouth Every 7 days    famotidine (PEPCID) 40 mg Oral Tablet Take 1 Tablet (40 mg total) by mouth Every morning    fluticasone  propionate (FLONASE ) 50 mcg/actuation Nasal Spray, Suspension Administer 1 Spray into each nostril Once a day    furosemide  (LASIX ) 40 mg Oral Tablet Take 1 Tablet (40 mg total) by mouth Once per day as needed    glipiZIDE (GLUCOTROL XL) 5 mg Oral Tablet Extended Rel 24 hr TAKE 1 (ONE) TABLET BEFORE DINNER    JARDIANCE 25 mg Oral Tablet Take 1 Tablet (25 mg total) by mouth Daily    losartan -hydrochlorothiazide  (HYZAAR ) 50-12.5 mg Oral Tablet Take 1 Tablet by mouth Once a day    MetFORMIN (GLUCOPHAGE) 1,000 mg Oral Tablet  Take 1 Tablet (1,000 mg total) by mouth Twice daily    montelukast  (SINGULAIR ) 10 mg Oral Tablet Take 1 Tablet (10 mg total) by mouth Daily    omeprazole (PRILOSEC) 40 mg Oral Capsule, Delayed Release(E.C.) TAKE 1 CAPSULE BY MOUTH EVERY DAY BEFORE DINNER    pravastatin (PRAVACHOL) 20 mg Oral Tablet Take 1 Tablet (20 mg total) by mouth Daily    tamsulosin  (FLOMAX ) 0.4 mg Oral Capsule TAKE 1 CAPSULE BY MOUTH EVERY DAY IN THE EVENING AFTER DINNER    tirzepatide  (MOUNJARO ) 5 mg/0.5 mL Subcutaneous Pen Injector Inject 0.5 mL (5 mg total) under the skin Every 7 days       Objective:     BP (!) 119/59 (Site: Left Arm, Patient Position: Sitting, Cuff Size: Adult)   Pulse 84   Temp 36.6 C (97.9 F) (Temporal)   Resp 20   Ht 1.854 m (6\' 1" )   Wt 122 kg (268 lb)   SpO2 93%   BMI 35.36 kg/m       BMI addressed: Advised on diet, weight loss, and exercise to reduce above normal BMI.     General appearance: alert, cooperative, in no acute distress.  Lungs: clear to auscultation bilaterally; no wheezes or rhonchi   Heart: regular rate and rhythm; normal S1 & S2; no murmur  Extremities: extremities normal ROM, no cyanosis or edema , no rash  Psych: alert and oriented x 3  Neuro: CN 2-12 grossly intact  Peripheral motor and sensory exams are grossly normal    Assessment/Plan     Assessment & Plan  Abnormal cardiovascular stress test  For second opinion with cardiologist at [facility]       Type 2 diabetes mellitus  A1C 5.8%  Pt has taken 2mg  Semaglutide for a few months and has not lost any wt.  Which would help with his other medical conditions.We discussed potential side effects such as N, C, D and the need for a regular bowel regime to avoid trouble.  Glucotrol 5mg    Jardiance  Metformin  DC Ozempic  Orders:    tirzepatide  (MOUNJARO ) 5 mg/0.5 mL Subcutaneous Pen Injector; Inject 0.5 mL (5 mg total) under the skin Every 7 days    Cirrhosis  ALT 57  AST 66   BILI WNL  AFP 6  HCV neg  PLTs 106  Upcoming RUQ US   He may feel  fatigue based on cirrhosis       DOE (dyspnea on exertion)  LDCT 10/24 did not show any pulmonary pathology  ECHO 02/25  EF 65%, normal cardiac valves, mild LCH, AA 4.1cm  Lexiscan  stress apparently showed mostly fixed inferior defect                        Health Maintenance:                   The patient was given the opportunity to ask questions and those questions were answered to the patient's satisfaction. The patient was encouraged to call with any additional questions or concerns. Instructed patient to call back if symptoms worse.   Discussed with patient effects and side effects of medications. Medication safety was discussed. A copy of the patient's medication list was printed and given to the patient. A good faith effort was made to reconcile the patient's medications.       Follow up: Return in about 7 weeks (around 10/22/2023).    Rebbeca Campi, MD

## 2023-08-31 NOTE — Assessment & Plan Note (Addendum)
 For second opinion with cardiologist at [facility]
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
## 2023-09-22 ENCOUNTER — Ambulatory Visit
Admission: RE | Admit: 2023-09-22 | Discharge: 2023-09-22 | Disposition: A | Discharge status: Home / Self Care | Source: Ambulatory Visit | Attending: Family | Admitting: Family

## 2023-09-22 ENCOUNTER — Other Ambulatory Visit: Payer: Self-pay

## 2023-09-22 DIAGNOSIS — K7460 Unspecified cirrhosis of liver: Secondary | ICD-10-CM | POA: Insufficient documentation

## 2023-10-01 ENCOUNTER — Emergency Department
Admission: EM | Admit: 2023-10-01 | Discharge: 2023-10-01 | Discharge status: Against Medical Advice | Attending: NURSE PRACTITIONER | Admitting: NURSE PRACTITIONER

## 2023-10-01 ENCOUNTER — Other Ambulatory Visit: Payer: Self-pay

## 2023-10-01 DIAGNOSIS — Z5321 Procedure and treatment not carried out due to patient leaving prior to being seen by health care provider: Secondary | ICD-10-CM | POA: Insufficient documentation

## 2023-10-01 LAB — URINALYSIS, MACROSCOPIC
BILIRUBIN: NEGATIVE mg/dL
BLOOD: NEGATIVE mg/dL
GLUCOSE: >1000 mg/dL — AB
KETONES: NEGATIVE mg/dL
LEUKOCYTES: NEGATIVE WBCs/uL
NITRITE: NEGATIVE
PH: 5 (ref 5.0–9.0)
PROTEIN: NEGATIVE mg/dL
SPECIFIC GRAVITY: 1.024 (ref 1.002–1.030)
UROBILINOGEN: NORMAL mg/dL

## 2023-10-01 LAB — URINALYSIS, MICROSCOPIC
HYALINE CASTS: 3 /LPF — ABNORMAL HIGH (ref <0)
NON-SQUAMOUS EPITHELIAL CELLS URINE: <1 /HPF (ref <=1)
RBCS: 1 /HPF (ref <4)
WBCS: <1 /HPF (ref <6)

## 2023-10-01 NOTE — ED Nurses Note (Signed)
 Called for the patient with no answer. The patient has left the ed.

## 2023-10-01 NOTE — ED Triage Notes (Signed)
 N/V/D started 2 days ago after eating [place] food, son in same household also has same symptoms after eating same foods. Does report symptoms have improved.

## 2023-10-20 ENCOUNTER — Ambulatory Visit (INDEPENDENT_AMBULATORY_CARE_PROVIDER_SITE_OTHER): Payer: Self-pay | Admitting: Specialist

## 2023-10-20 ENCOUNTER — Other Ambulatory Visit: Payer: Self-pay

## 2023-10-20 ENCOUNTER — Encounter (INDEPENDENT_AMBULATORY_CARE_PROVIDER_SITE_OTHER): Payer: Self-pay | Admitting: Specialist

## 2023-10-20 VITALS — BP 132/65 | HR 85 | Temp 98.2°F | Ht 73.0 in | Wt 260.2 lb

## 2023-10-20 DIAGNOSIS — I2510 Atherosclerotic heart disease of native coronary artery without angina pectoris: Secondary | ICD-10-CM

## 2023-10-20 NOTE — Progress Notes (Signed)
 CARDIOLOGY, [facility]  [address] [place] [address]       Cardiology Progress Note    Name: Miguel Hendricks MRN:  [id number]   Date: 10/20/2023 Age: 73 y.o.     Chief Compliant:  Chief Complaint              Follow Up After Cath            HPI:  Dyspnea on exertion was found to have considerable calcium on studies and had up has been catheterized by a Dr. Felice Hora in [place].  He was found to have moderate nonobstructive coronary artery disease.  There was 30% narrowing of the LAD and several diagonal branches a PDA was found to have moderate irregularities on exam today he appears in no acute distress the blood pressure 132/65 cardiac-wise he is in a regular rhythm there is no murmur.  No change in his medication was made he is to continue on his long-term statin therapy along with aspirin in beta-blocker with Coreg.  He is a diabetic and relates that he has recently lost 6 lb.  He is doing well and remains asymptomatic.      Past Medical History  Past Medical History:   Diagnosis Date    Allergic rhinitis     Diabetes mellitus     DNS (deviated nasal septum)     Esophageal reflux     Essential hypertension     Hx of skin malignancy     Impacted cerumen of both ears      Past Surgical History:   Procedure Laterality Date    HX TONSILLECTOMY  1959    SURGERY SCROTAL / TESTICULAR  2004    [facility]      Family Medical History:    None        Tobacco Use History[1]   Current Outpatient Medications   Medication Sig    aspirin 81 mg Oral Tablet, Chewable Chew 1 Tablet (81 mg total) One time    carvediloL (COREG) 3.125 mg Oral Tablet Take 1 Tablet (3.125 mg total) by mouth Twice daily    cetirizine  (ZYRTEC ) 10 mg Oral Tablet Take 1 Tablet (10 mg total) by mouth Daily    cyclobenzaprine  (FLEXERIL ) 10 mg Oral Tablet TAKE 1 TABLET (10 MG TOTAL) BY MOUTH THREE TIMES A DAY INDICATIONS: MUSCLE SPASM    ENBREL SURECLICK 50 mg/mL (1 mL) Subcutaneous Pen Injector Inject 1 mL (50 mg total) under the skin Every 7  days    ergocalciferol, vitamin D2, (DRISDOL) 1,250 mcg (50,000 unit) Oral Capsule Take 1 Capsule (50,000 Units total) by mouth Every 7 days    famotidine (PEPCID) 40 mg Oral Tablet Take 1 Tablet (40 mg total) by mouth Every morning    fluticasone  propionate (FLONASE ) 50 mcg/actuation Nasal Spray, Suspension Administer 1 Spray into each nostril Once a day    furosemide  (LASIX ) 40 mg Oral Tablet Take 1 Tablet (40 mg total) by mouth Once per day as needed    glipiZIDE (GLUCOTROL XL) 5 mg Oral Tablet Extended Rel 24 hr TAKE 1 (ONE) TABLET BEFORE DINNER    JARDIANCE 25 mg Oral Tablet Take 1 Tablet (25 mg total) by mouth Daily    losartan -hydrochlorothiazide  (HYZAAR ) 50-12.5 mg Oral Tablet Take 1 Tablet by mouth Once a day    MetFORMIN (GLUCOPHAGE) 1,000 mg Oral Tablet Take 1 Tablet (1,000 mg total) by mouth Twice daily    montelukast  (SINGULAIR ) 10 mg Oral Tablet  Take 1 Tablet (10 mg total) by mouth Daily    omeprazole (PRILOSEC) 40 mg Oral Capsule, Delayed Release(E.C.) TAKE 1 CAPSULE BY MOUTH EVERY DAY BEFORE DINNER    pravastatin (PRAVACHOL) 20 mg Oral Tablet Take 1 Tablet (20 mg total) by mouth Daily    tamsulosin  (FLOMAX ) 0.4 mg Oral Capsule TAKE 1 CAPSULE BY MOUTH EVERY DAY IN THE EVENING AFTER DINNER    tirzepatide  (MOUNJARO ) 5 mg/0.5 mL Subcutaneous Pen Injector Inject 0.5 mL (5 mg total) under the skin Every 7 days     Allergies[2]    Review of Systems:  A focused review of systems was completed as described in the HPI.    BP 132/65 (Site: Left Arm, Patient Position: Sitting, Cuff Size: Adult)   Pulse 85   Temp 36.8 C (98.2 F) (Temporal)   Ht 1.854 m (6' 1)   Wt 118 kg (260 lb 3.2 oz)   SpO2 93%   BMI 34.33 kg/m       See cardiac examination above    Assessment and Plan:will return in 6 months unless he should have further cardiac issues under copy of this note to Dr. Miki Alert office    See additional plan details above    Seena Dadds, MD         [1]   Social History  Tobacco Use   Smoking  Status Former    Current packs/day: 0.00    Average packs/day: 1 pack/day for 50.0 years (50.0 ttl pk-yrs)    Types: Cigarettes    Start date: 79    Quit date: 2021    Years since quitting: 4.4   Smokeless Tobacco Never   Tobacco Comments    Counseled by DdrCharon Copper on 10/20/22   [2] No Known Allergies

## 2023-10-22 ENCOUNTER — Ambulatory Visit (INDEPENDENT_AMBULATORY_CARE_PROVIDER_SITE_OTHER): Payer: Self-pay | Admitting: Internal Medicine

## 2023-10-26 ENCOUNTER — Ambulatory Visit (INDEPENDENT_AMBULATORY_CARE_PROVIDER_SITE_OTHER): Payer: Self-pay | Attending: Internal Medicine | Admitting: Internal Medicine

## 2023-10-26 ENCOUNTER — Other Ambulatory Visit: Payer: Self-pay

## 2023-10-26 ENCOUNTER — Encounter (INDEPENDENT_AMBULATORY_CARE_PROVIDER_SITE_OTHER): Payer: Self-pay | Admitting: Internal Medicine

## 2023-10-26 VITALS — BP 118/60 | HR 82 | Temp 97.5°F | Ht 73.0 in | Wt 259.0 lb

## 2023-10-26 DIAGNOSIS — Z7984 Long term (current) use of oral hypoglycemic drugs: Secondary | ICD-10-CM

## 2023-10-26 DIAGNOSIS — I77810 Thoracic aortic ectasia: Secondary | ICD-10-CM | POA: Insufficient documentation

## 2023-10-26 DIAGNOSIS — Z860100 Personal history of colon polyps, unspecified: Secondary | ICD-10-CM | POA: Insufficient documentation

## 2023-10-26 DIAGNOSIS — E785 Hyperlipidemia, unspecified: Secondary | ICD-10-CM | POA: Insufficient documentation

## 2023-10-26 DIAGNOSIS — K7460 Unspecified cirrhosis of liver: Secondary | ICD-10-CM | POA: Insufficient documentation

## 2023-10-26 DIAGNOSIS — I2510 Atherosclerotic heart disease of native coronary artery without angina pectoris: Secondary | ICD-10-CM | POA: Insufficient documentation

## 2023-10-26 DIAGNOSIS — Z7985 Long-term (current) use of injectable non-insulin antidiabetic drugs: Secondary | ICD-10-CM

## 2023-10-26 DIAGNOSIS — Z87891 Personal history of nicotine dependence: Secondary | ICD-10-CM

## 2023-10-26 DIAGNOSIS — F17200 Nicotine dependence, unspecified, uncomplicated: Secondary | ICD-10-CM | POA: Insufficient documentation

## 2023-10-26 DIAGNOSIS — I2584 Coronary atherosclerosis due to calcified coronary lesion: Secondary | ICD-10-CM | POA: Insufficient documentation

## 2023-10-26 DIAGNOSIS — E1159 Type 2 diabetes mellitus with other circulatory complications: Secondary | ICD-10-CM | POA: Insufficient documentation

## 2023-10-26 MED ORDER — TIRZEPATIDE 7.5 MG/0.5 ML SUBCUTANEOUS PEN INJECTOR
7.5000 mg | PEN_INJECTOR | SUBCUTANEOUS | 0 refills | Status: DC
Start: 2023-10-26 — End: 2023-12-04

## 2023-10-26 NOTE — Nursing Note (Signed)
 10/26/23 0906   Depression Screen   Little interest or pleasure in doing things. 0   Feeling down, depressed, or hopeless 0   PHQ 2 Total 0

## 2023-10-26 NOTE — Assessment & Plan Note (Addendum)
 LDCT 10/24- Repeat one yr  1 ppd since 1975  Pt quit 2021

## 2023-10-26 NOTE — Assessment & Plan Note (Addendum)
 Glipizide 5mg  daily  Jardiance 25 mg daily  Metformin 1000 mg twice daily  Advised pt that if his FBS goes below 150 he can decrease dose of Glipizide or if below 100 stop it altogether.  Advised that that is the one medication that puts him at risk of a low BS.   Orders:    tirzepatide  (MOUNJARO ) 7.5 mg/0.5 mL Subcutaneous Pen Injector; Inject 0.5 mL (7.5 mg total) under the skin Every 7 days
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
## 2023-10-26 NOTE — Assessment & Plan Note (Addendum)
 2024 IMPRESSION:  THERE IS A NONENHANCING MASS CORRESPONDING TO THE AREA FROM THE ULTRASOUND ULTRASOUND IN THE RIGHT LOBE THE LIVER WHICH IS MORE PRONOUNCED THAN ON THE PRIOR MRI ALTHOUGH WITHOUT ANY SUSPICIOUS FEATURES. THERE ARE NO ENHANCING LIVER LESIONS ON THIS EXAM.    Plts 106  AFP 6  Ot/Pt minimally elevated 03/25

## 2023-10-26 NOTE — Nursing Note (Signed)
 10/26/23 4010   Fall Risk Assessment   Do you feel unsteady when standing or walking? No   Do you worry about falling? No   Have you fallen in the past year? Yes   How many times have you fallen? Once   Were you ever injured from falling? No

## 2023-10-26 NOTE — Progress Notes (Signed)
 FAMILY MEDICINE, [facility]  [address]  [place] [address]  Operated by [facility]    Miguel Hendricks  10-Dec-1950  [id number]    Date of Service: 10/26/2023  9:00 AM EDT    Chief complaint:   Chief Complaint   Patient presents with    Follow Up     7 week f/u        Subjective:     This is a case of a 73 y.o. year old male who comes in today for CDM.    He thinks he had an A1C around 6%.  He will see Sofi this week.     He says that his BS runs around 160-170 fasting.     Wt Readings from Last 6 Encounters:   10/26/23 117 kg (259 lb)   10/20/23 118 kg (260 lb 3.2 oz)   10/01/23 122 kg (268 lb)   08/31/23 122 kg (268 lb)   08/26/23 121 kg (267 lb 9.6 oz)   08/17/23 120 kg (265 lb)         He is not experiencing any GI side effects from Tirzepatide  and would like to increase dose.      Recent lab results reviewed and noted.  BASIC METABOLIC PANEL  Lab Results   Component Value Date    SODIUM 138 06/30/2023    POTASSIUM 4.5 06/30/2023    CHLORIDE 104 06/30/2023    CO2 27 06/30/2023    ANIONGAP 7 06/30/2023    BUN 13 06/30/2023    CREATININE 0.77 06/30/2023    BUNCRRATIO 17 06/30/2023    GFR 96 06/30/2023    CALCIUM 10.3 06/30/2023    GLUCOSE >1000 (A) 10/01/2023    GLUCOSENF 136 (H) 06/30/2023      LIPID PROFILE  Lab Results   Component Value Date    CHOLESTEROL 123 06/30/2023    HDLCHOL 36 (L) 06/30/2023    LDLCHOL 39 06/30/2023    TRIG 239 (H) 06/30/2023       ROS:  Constitutional - no appetite or weight changes, no fatigue, no fevers, chills, or night sweats  Respiratory - no dyspnea or cough  Cardiovascular - no chest pain or palpitations  Gastrointestinal - no nausea, vomiting, diarrhea, or constipation, no dyspepsia  Endocrine - no heat or cold intolerance, no polyuria, polydipsia, or polyphagia  Skin - no rashes, color changes, or lesions  Musculoskeletal - no arthralgias or myalgias  Genitourinary - no urinary frequency or dysuria, no genital discharge  Neurologic - no  vision or hearing changes, no weakness, no parasthesias   Psychiatric - mood has been appropriate, no depression or anxiety    Patient Active Problem List    Diagnosis Date Noted    Cirrhosis 02/19/2023    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) 04/18/2022    Gastritis 04/18/2022    Hiatal hernia 04/18/2022    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    History of colon polyps 04/17/2022    Dyslipidemia 08/05/2021    Tobacco use disorder 08/05/2021    Abnormal cardiovascular stress test 06/09/2007       Past Surgical History:   Procedure Laterality Date    HX TONSILLECTOMY  1959    SURGERY SCROTAL / TESTICULAR  2004    [facility]           Current Outpatient Medications   Medication Sig  aspirin 81 mg Oral Tablet, Chewable Chew 1 Tablet (81 mg total) One time    carvediloL (COREG) 3.125 mg Oral Tablet Take 1 Tablet (3.125 mg total) by mouth Twice daily    cetirizine  (ZYRTEC ) 10 mg Oral Tablet Take 1 Tablet (10 mg total) by mouth Daily    cyclobenzaprine  (FLEXERIL ) 10 mg Oral Tablet TAKE 1 TABLET (10 MG TOTAL) BY MOUTH THREE TIMES A DAY INDICATIONS: MUSCLE SPASM    ENBREL SURECLICK 50 mg/mL (1 mL) Subcutaneous Pen Injector Inject 1 mL (50 mg total) under the skin Every 7 days    ergocalciferol, vitamin D2, (DRISDOL) 1,250 mcg (50,000 unit) Oral Capsule Take 1 Capsule (50,000 Units total) by mouth Every 7 days    famotidine (PEPCID) 40 mg Oral Tablet Take 1 Tablet (40 mg total) by mouth Every morning    fluticasone  propionate (FLONASE ) 50 mcg/actuation Nasal Spray, Suspension Administer 1 Spray into each nostril Once a day    furosemide  (LASIX ) 40 mg Oral Tablet Take 1 Tablet (40 mg total) by mouth Once per day as needed    glipiZIDE (GLUCOTROL XL) 5 mg Oral Tablet Extended Rel 24 hr TAKE 1 (ONE) TABLET BEFORE DINNER    JARDIANCE 25 mg Oral Tablet Take 1 Tablet (25 mg total) by mouth Daily     losartan -hydrochlorothiazide  (HYZAAR ) 50-12.5 mg Oral Tablet Take 1 Tablet by mouth Once a day    MetFORMIN (GLUCOPHAGE) 1,000 mg Oral Tablet Take 1 Tablet (1,000 mg total) by mouth Twice daily    montelukast  (SINGULAIR ) 10 mg Oral Tablet Take 1 Tablet (10 mg total) by mouth Daily    omeprazole (PRILOSEC) 40 mg Oral Capsule, Delayed Release(E.C.) TAKE 1 CAPSULE BY MOUTH EVERY DAY BEFORE DINNER    pravastatin (PRAVACHOL) 20 mg Oral Tablet Take 1 Tablet (20 mg total) by mouth Daily    tamsulosin  (FLOMAX ) 0.4 mg Oral Capsule TAKE 1 CAPSULE BY MOUTH EVERY DAY IN THE EVENING AFTER DINNER    tirzepatide  (MOUNJARO ) 5 mg/0.5 mL Subcutaneous Pen Injector Inject 0.5 mL (5 mg total) under the skin Every 7 days       Objective:     Ht 1.854 m (6' 1)   Wt 117 kg (259 lb)   BMI 34.17 kg/m       BMI addressed: Advised on diet, weight loss, and exercise to reduce above normal BMI.     General appearance: alert, cooperative, in no acute distress  Lungs: clear to auscultation bilaterally; no wheezes or rhonchi   Heart: regular rate and rhythm; normal S1 & S2; no murmur  Extremities: extremities normal ROM, no cyanosis or edema , no rash  Psych: alert and oriented x 3  Neuro: CN 2-12 grossly intact  Peripheral motor and sensory exams are grossly normal    Assessment/Plan     Assessment & Plan  Cirrhosis  2024 IMPRESSION:  THERE IS A NONENHANCING MASS CORRESPONDING TO THE AREA FROM THE ULTRASOUND ULTRASOUND IN THE RIGHT LOBE THE LIVER WHICH IS MORE PRONOUNCED THAN ON THE PRIOR MRI ALTHOUGH WITHOUT ANY SUSPICIOUS FEATURES. THERE ARE NO ENHANCING LIVER LESIONS ON THIS EXAM.    Plts 106  AFP 6  Ot/Pt minimally elevated 03/25         History of colon polyps  Colon 2024 Lydia Sams- obtain report       Dyslipidemia  Pravastatin 20 mg  He is not on a high intensity statin due to his cirrhosis.        Type 2 diabetes mellitus  with circulatory disorder  Glipizide 5mg  daily  Jardiance 25 mg daily  Metformin 1000 mg twice daily  Advised pt that if  his FBS goes below 150 he can decrease dose of Glipizide or if below 100 stop it altogether.  Advised that that is the one medication that puts him at risk of a low BS.   Orders:    tirzepatide  (MOUNJARO ) 7.5 mg/0.5 mL Subcutaneous Pen Injector; Inject 0.5 mL (7.5 mg total) under the skin Every 7 days    Tobacco use disorder  LDCT 10/24- Repeat one yr  1 ppd since 1975  Pt quit 2021         Coronary artery disease due to calcified coronary lesion  Moderate per cath with McL[facility]ie 05/25- medical tx    Meds:  ASA 81 mg  Coreg  Pravasatatin 20 mg LDL 39  02/25         Dilatation of thoracic aorta (CMS HCC)  2/25 ASC aorta 4.4cm  CTA chest/abd/pelvis did not show aneurysm but 4.4cm asc thoracic aorta dilatation                       Health Maintenance:                   The patient was given the opportunity to ask questions and those questions were answered to the patient's satisfaction. The patient was encouraged to call with any additional questions or concerns. Instructed patient to call back if symptoms worse.   Discussed with patient effects and side effects of medications. Medication safety was discussed. A copy of the patient's medication list was printed and given to the patient. A good faith effort was made to reconcile the patient's medications.       Follow up: No follow-ups on file.    Rebbeca Campi, MD

## 2023-10-26 NOTE — Assessment & Plan Note (Addendum)
 Colon 2024 Miguel Hendricks- obtain report

## 2023-10-26 NOTE — Assessment & Plan Note (Addendum)
 Pravastatin 20 mg  He is not on a high intensity statin due to his cirrhosis.

## 2023-10-26 NOTE — Assessment & Plan Note (Addendum)
 2/25 ASC aorta 4.4cm  CTA chest/abd/pelvis did not show aneurysm but 4.4cm asc thoracic aorta dilatation

## 2023-10-26 NOTE — Assessment & Plan Note (Addendum)
 Moderate per cath with McLuckie 05/25- medical tx    Meds:  ASA 81 mg  Coreg  Pravasatatin 20 mg LDL 39  02/25

## 2023-10-30 ENCOUNTER — Other Ambulatory Visit (INDEPENDENT_AMBULATORY_CARE_PROVIDER_SITE_OTHER): Payer: Self-pay | Admitting: Internal Medicine

## 2023-11-09 ENCOUNTER — Other Ambulatory Visit (INDEPENDENT_AMBULATORY_CARE_PROVIDER_SITE_OTHER): Payer: Self-pay

## 2023-11-09 ENCOUNTER — Encounter (INDEPENDENT_AMBULATORY_CARE_PROVIDER_SITE_OTHER): Payer: Self-pay | Admitting: Internal Medicine

## 2023-11-20 ENCOUNTER — Other Ambulatory Visit (HOSPITAL_COMMUNITY): Payer: Self-pay | Admitting: Family

## 2023-11-20 DIAGNOSIS — R935 Abnormal findings on diagnostic imaging of other abdominal regions, including retroperitoneum: Secondary | ICD-10-CM

## 2023-12-01 ENCOUNTER — Other Ambulatory Visit (INDEPENDENT_AMBULATORY_CARE_PROVIDER_SITE_OTHER): Payer: Self-pay | Admitting: Internal Medicine

## 2023-12-01 DIAGNOSIS — M545 Low back pain, unspecified: Secondary | ICD-10-CM

## 2023-12-02 ENCOUNTER — Other Ambulatory Visit (INDEPENDENT_AMBULATORY_CARE_PROVIDER_SITE_OTHER): Payer: Self-pay | Admitting: Internal Medicine

## 2023-12-04 ENCOUNTER — Other Ambulatory Visit (INDEPENDENT_AMBULATORY_CARE_PROVIDER_SITE_OTHER): Payer: Self-pay | Admitting: Internal Medicine

## 2023-12-04 DIAGNOSIS — E1159 Type 2 diabetes mellitus with other circulatory complications: Secondary | ICD-10-CM

## 2023-12-04 MED ORDER — TIRZEPATIDE 7.5 MG/0.5 ML SUBCUTANEOUS PEN INJECTOR
7.5000 mg | PEN_INJECTOR | SUBCUTANEOUS | 0 refills | Status: DC
Start: 2023-12-04 — End: 2024-02-23

## 2023-12-25 ENCOUNTER — Ambulatory Visit: Attending: Family

## 2023-12-25 ENCOUNTER — Other Ambulatory Visit: Payer: Self-pay

## 2023-12-25 DIAGNOSIS — R935 Abnormal findings on diagnostic imaging of other abdominal regions, including retroperitoneum: Secondary | ICD-10-CM | POA: Insufficient documentation

## 2023-12-25 LAB — COMPREHENSIVE METABOLIC PANEL, NON-FASTING
ALBUMIN/GLOBULIN RATIO: 1 (ref 0.8–1.4)
ALBUMIN: 4.2 g/dL (ref 3.5–5.7)
ALKALINE PHOSPHATASE: 70 U/L (ref 34–104)
ALT (SGPT): 52 U/L (ref 7–52)
ANION GAP: 8 mmol/L (ref 4–13)
AST (SGOT): 66 U/L — ABNORMAL HIGH (ref 13–39)
BILIRUBIN TOTAL: 0.6 mg/dL (ref 0.3–1.0)
BUN/CREA RATIO: 17 (ref 6–22)
BUN: 14 mg/dL (ref 7–25)
CALCIUM, CORRECTED: 9.7 mg/dL (ref 8.9–10.8)
CALCIUM: 9.9 mg/dL (ref 8.6–10.3)
CHLORIDE: 103 mmol/L (ref 98–107)
CO2 TOTAL: 26 mmol/L (ref 21–31)
CREATININE: 0.84 mg/dL (ref 0.60–1.30)
ESTIMATED GFR: 93 mL/min/1.73mˆ2 (ref >59)
GLOBULIN: 4.1 — ABNORMAL HIGH (ref 2.0–3.5)
GLUCOSE: 153 mg/dL — ABNORMAL HIGH (ref 74–109)
OSMOLALITY, CALCULATED: 277 mosm/kg (ref 270–290)
POTASSIUM: 3.7 mmol/L (ref 3.5–5.1)
PROTEIN TOTAL: 8.3 g/dL (ref 6.4–8.9)
SODIUM: 137 mmol/L (ref 136–145)

## 2023-12-25 LAB — CBC
HCT: 40 % (ref 36.7–47.1)
HGB: 13.7 g/dL (ref 12.5–16.3)
MCH: 31 pg (ref 23.8–33.4)
MCHC: 34.4 g/dL (ref 32.5–36.3)
MCV: 90.3 fL (ref 73.0–96.2)
MPV: 9.9 fL (ref 7.4–11.4)
PLATELETS: 101 x10ˆ3/uL — ABNORMAL LOW (ref 140–440)
RBC: 4.43 x10ˆ6/uL (ref 4.06–5.63)
RDW: 14 % (ref 12.1–16.2)
WBC: 4.2 x10ˆ3/uL (ref 3.6–10.2)

## 2023-12-26 LAB — ALPHA FETOPROTEIN (AFP) TUMOR MARKER: AFP TUMOR MARKER: 6 ng/mL (ref <=9)

## 2023-12-27 ENCOUNTER — Ambulatory Visit
Admission: RE | Admit: 2023-12-27 | Discharge: 2023-12-27 | Disposition: A | Discharge status: Home / Self Care | Source: Ambulatory Visit | Attending: Family | Admitting: Family

## 2023-12-27 ENCOUNTER — Other Ambulatory Visit: Payer: Self-pay

## 2023-12-27 DIAGNOSIS — R935 Abnormal findings on diagnostic imaging of other abdominal regions, including retroperitoneum: Secondary | ICD-10-CM | POA: Insufficient documentation

## 2023-12-27 MED ORDER — GADOBUTROL 10 MMOL/10 ML (1 MMOL/ML) INTRAVENOUS SOLUTION
10.0000 mL | INTRAVENOUS | Status: AC
Start: 2023-12-27 — End: 2023-12-27
  Administered 2023-12-27: 10 mL via INTRAVENOUS

## 2023-12-28 DIAGNOSIS — R160 Hepatomegaly, not elsewhere classified: Secondary | ICD-10-CM

## 2024-01-12 ENCOUNTER — Other Ambulatory Visit (INDEPENDENT_AMBULATORY_CARE_PROVIDER_SITE_OTHER): Payer: Self-pay

## 2024-01-27 ENCOUNTER — Ambulatory Visit (INDEPENDENT_AMBULATORY_CARE_PROVIDER_SITE_OTHER): Payer: Self-pay | Admitting: Internal Medicine

## 2024-02-11 ENCOUNTER — Other Ambulatory Visit (INDEPENDENT_AMBULATORY_CARE_PROVIDER_SITE_OTHER): Payer: Self-pay | Admitting: Internal Medicine

## 2024-02-11 DIAGNOSIS — I10 Essential (primary) hypertension: Secondary | ICD-10-CM

## 2024-02-12 ENCOUNTER — Ambulatory Visit: Attending: Orthopaedic Surgery

## 2024-02-12 ENCOUNTER — Other Ambulatory Visit: Payer: Self-pay

## 2024-02-12 ENCOUNTER — Ambulatory Visit (HOSPITAL_COMMUNITY): Admitting: Orthopaedic Surgery

## 2024-02-12 DIAGNOSIS — M25462 Effusion, left knee: Secondary | ICD-10-CM

## 2024-02-12 LAB — SYNOVIAL FLUID
BODY FLUID VOLUME: 20 mL
NUCLEATED CELLS, FLUID: 17201 /uL

## 2024-02-12 LAB — SYNOVIAL FLUID DIFFERENTIAL
NEUTROPHIL %: 87 %
OTHER CELL %: 13 %

## 2024-02-12 LAB — SYNOVIAL (JOINT) FLUID CRYSTALS

## 2024-02-16 ENCOUNTER — Ambulatory Visit (INDEPENDENT_AMBULATORY_CARE_PROVIDER_SITE_OTHER): Payer: Self-pay | Admitting: OTOLARYNGOLOGY

## 2024-02-16 ENCOUNTER — Encounter (INDEPENDENT_AMBULATORY_CARE_PROVIDER_SITE_OTHER): Payer: Self-pay

## 2024-02-17 LAB — FLUID CULTURE & GRAM STAIN
GRAM STAIN: NONE SEEN
GRAM STAIN: NONE SEEN
STERILE SITE CULTURE, AEROBIC: NO GROWTH

## 2024-02-21 ENCOUNTER — Other Ambulatory Visit (INDEPENDENT_AMBULATORY_CARE_PROVIDER_SITE_OTHER): Payer: Self-pay | Admitting: OTOLARYNGOLOGY

## 2024-02-22 ENCOUNTER — Encounter (INDEPENDENT_AMBULATORY_CARE_PROVIDER_SITE_OTHER): Payer: Self-pay | Admitting: OTOLARYNGOLOGY

## 2024-02-22 ENCOUNTER — Other Ambulatory Visit: Payer: Self-pay

## 2024-02-22 ENCOUNTER — Ambulatory Visit: Payer: Self-pay | Attending: OTOLARYNGOLOGY | Admitting: OTOLARYNGOLOGY

## 2024-02-22 VITALS — Ht 73.0 in | Wt 259.0 lb

## 2024-02-22 DIAGNOSIS — H7291 Unspecified perforation of tympanic membrane, right ear: Secondary | ICD-10-CM | POA: Insufficient documentation

## 2024-02-22 DIAGNOSIS — J342 Deviated nasal septum: Secondary | ICD-10-CM | POA: Insufficient documentation

## 2024-02-22 DIAGNOSIS — J310 Chronic rhinitis: Secondary | ICD-10-CM | POA: Insufficient documentation

## 2024-02-22 DIAGNOSIS — J309 Allergic rhinitis, unspecified: Secondary | ICD-10-CM | POA: Insufficient documentation

## 2024-02-22 DIAGNOSIS — H6993 Unspecified Eustachian tube disorder, bilateral: Secondary | ICD-10-CM | POA: Insufficient documentation

## 2024-02-22 MED ORDER — MONTELUKAST 10 MG TABLET
10.0000 mg | ORAL_TABLET | Freq: Every day | ORAL | 3 refills | Status: AC
Start: 2024-02-22 — End: ?

## 2024-02-22 MED ORDER — CETIRIZINE 10 MG TABLET
10.0000 mg | ORAL_TABLET | Freq: Every day | ORAL | 3 refills | Status: AC
Start: 2024-02-22 — End: ?

## 2024-02-22 NOTE — H&P (Signed)
 ENT, [facility]  [address]  [place] [address]  Operated by [facility]  Return Patient Visit    Name: JAIR LINDBLAD MRN:  [id number]   Date: 02/22/2024 DOB: 03/23/1951 (73 y.o.)       Referring Provider:  Layman Calhoun CROME, MD    Reason for Visit:   Chief Complaint   Patient presents with    Follow Up 6 Months     6 month rc on chronic right TM perf.        History of Present Illness:  Miguel Hendricks is a 73 y.o. male who is FU on AR and chronic AD TM perf. Taking zyrtec , Flonase  and Singulair , and working well. No complaints.     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)    Gastritis    Hiatal hernia    Cirrhosis    Type 2 diabetes mellitus with circulatory disorder    Coronary artery disease due to calcified coronary lesion     Current Outpatient Medications   Medication Sig    aspirin 81 mg Oral Tablet, Chewable Chew 1 Tablet (81 mg total) One time    carvediloL (COREG) 3.125 mg Oral Tablet Take 1 Tablet (3.125 mg total) by mouth Twice daily    cetirizine  (ZYRTEC ) 10 mg Oral Tablet Take 1 Tablet (10 mg total) by mouth Daily    cyclobenzaprine  (FLEXERIL ) 10 mg Oral Tablet TAKE 1 TABLET (10 MG TOTAL) BY MOUTH THREE TIMES A DAY INDICATIONS: MUSCLE SPASM    ENBREL SURECLICK 50 mg/mL (1 mL) Subcutaneous Pen Injector Inject 1 mL (50 mg total) under the skin Every 7 days    ergocalciferol, vitamin D2, (DRISDOL) 1,250 mcg (50,000 unit) Oral Capsule Take 1 Capsule (50,000 Units total) by mouth Every 7 days    famotidine (PEPCID) 40 mg Oral Tablet Take 1 Tablet (40 mg total) by mouth Every morning    fluticasone  propionate (FLONASE ) 50 mcg/actuation Nasal Spray, Suspension SPRAY 1 SPRAY INTO EACH  NOSTRIL EVERY DAY    furosemide  (LASIX ) 40 mg Oral Tablet TAKE 1 TABLET (40 MG TOTAL) BY MOUTH ONCE PER DAY AS NEEDED    glipiZIDE (GLUCOTROL XL) 5 mg Oral Tablet Extended Rel 24 hr TAKE 1 (ONE) TABLET BEFORE DINNER    JARDIANCE 25 mg Oral Tablet Take 1 Tablet (25 mg total) by mouth Daily    losartan -hydrochlorothiazide  (HYZAAR ) 50-12.5 mg Oral Tablet Take 1 Tablet by mouth Once a day    MetFORMIN (GLUCOPHAGE) 1,000 mg Oral Tablet Take 1 Tablet (1,000 mg total) by mouth Twice daily    montelukast  (SINGULAIR ) 10 mg Oral Tablet Take 1 Tablet (10 mg total) by mouth Daily    omeprazole (PRILOSEC) 40 mg Oral Capsule, Delayed Release(E.C.) TAKE 1 CAPSULE BY MOUTH EVERY DAY BEFORE DINNER    pravastatin (PRAVACHOL) 20 mg Oral Tablet Take 1 Tablet (20 mg total) by mouth Daily    tamsulosin  (FLOMAX ) 0.4 mg Oral Capsule TAKE 1 CAPSULE BY MOUTH EVERY DAY IN THE EVENING AFTER DINNER    tirzepatide  (MOUNJARO ) 7.5 mg/0.5 mL Subcutaneous Pen Injector Inject 0.5 mL (7.5 mg total) under the skin Every 7 days Indications:  type 2 diabetes mellitus      No Known Allergies  Past Medical History:   Diagnosis Date    Allergic rhinitis     Diabetes mellitus     DNS (deviated nasal septum)     Esophageal reflux     Essential hypertension     Hx of skin malignancy     Impacted cerumen of both ears      Past Surgical History:   Procedure Laterality Date    HX TONSILLECTOMY  1959    SURGERY SCROTAL / TESTICULAR  2004    [facility]     Family Medical History:    None         Social History     Tobacco Use    Smoking status: Former     Current packs/day: 0.00     Average packs/day: 1 pack/day for 50.0 years (50.0 ttl pk-yrs)     Types: Cigarettes     Start date: 77     Quit date: 2021     Years since quitting: 4.7    Smokeless tobacco: Never    Tobacco comments:     Counseled by Dr. Layman on 06/16/23   Vaping Use    Vaping status: Never Used   Substance Use Topics    Alcohol use: Not Currently    Drug use: Not Currently       Review of  Systems:  Review of Systems    Physical Exam:  Ht 1.854 m (6' 1)   Wt 117 kg (259 lb)   BMI 34.17 kg/m       ENT Physical Exam  Constitutional  Appearance: patient appears well-developed, well-nourished and well-groomed,  Communication/Voice: communication appropriate for developmental age; vocal quality normal;  Head and Face  Appearance: head appears normal, face appears normal and face appears atraumatic;  Palpation: facial palpation normal;  Salivary: glands normal;  Ear  Hearing: intact;  Auricles: right auricle normal; left auricle normal;  External Mastoids: right external mastoid normal; left external mastoid normal;  Ear Canals: bilateral ear canals impacted cerumen observed;  Tympanic Membranes: left tympanic membrane normal;  Ear comments: 40% perforation dry  Nose  External Nose: nares patent bilaterally; external nose normal;  Internal Nose: nasal mucosa normal; septum normal; bilateral inferior turbinates normal;  Oral Cavity/Oropharynx  Lips: normal;  Teeth: normal;  Gums: gingiva normal;  Tongue: normal;  Oral mucosa: normal;  Hard palate: normal;  Soft palate: normal;  Tonsils: normal;  Base of Tongue: normal;  Posterior pharyngeal wall: normal;  Neck  Neck: neck normal; neck palpation normal;  Thyroid : thyroid  normal;  Respiratory  Inspection: breathing unlabored; normal breathing rate;  Lymphatic  Palpation: lymph nodes normal;  Neurovestibular  Mental Status: alert and oriented;  Psychiatric: mood normal; affect is appropriate;  Cranial Nerves: cranial nerves intact;       Assessment:  ENCOUNTER DIAGNOSES     ICD-10-CM   1. ETD (Eustachian tube dysfunction), bilateral  H69.93   2. Nasal septal deviation  J34.2   3. Chronic rhinitis  J31.0   4. Tympanic membrane perforation, right  H72.91   5. Allergic rhinitis, unspecified seasonality, unspecified trigger  J30.9       Plan:  Medical records reviewed on 02/22/2024. Refilled zyrtec  and Singulair  for chronic allergies. Right perforation is  stable.  Dry ear precautions.    Orders Placed This Encounter    POCT HEARING/VISION/TYMPANOGRAM (AMB ONLY)    cetirizine  (ZYRTEC ) 10 mg Oral Tablet  montelukast  (SINGULAIR ) 10 mg Oral Tablet     Return in about 6 months (around 08/22/2024).

## 2024-02-23 ENCOUNTER — Other Ambulatory Visit (INDEPENDENT_AMBULATORY_CARE_PROVIDER_SITE_OTHER): Payer: Self-pay | Admitting: Internal Medicine

## 2024-02-23 MED ORDER — MOUNJARO 10 MG/0.5 ML SUBCUTANEOUS PEN INJECTOR
10.0000 mg | PEN_INJECTOR | SUBCUTANEOUS | 0 refills | Status: DC
Start: 2024-02-23 — End: 2024-03-07

## 2024-02-24 ENCOUNTER — Ambulatory Visit
Admission: RE | Admit: 2024-02-24 | Discharge: 2024-02-24 | Disposition: A | Discharge status: Home / Self Care | Payer: Self-pay | Source: Ambulatory Visit | Attending: Internal Medicine | Admitting: Internal Medicine

## 2024-02-24 ENCOUNTER — Other Ambulatory Visit: Payer: Self-pay

## 2024-02-24 ENCOUNTER — Ambulatory Visit (INDEPENDENT_AMBULATORY_CARE_PROVIDER_SITE_OTHER): Payer: Self-pay | Admitting: Internal Medicine

## 2024-02-24 DIAGNOSIS — F17201 Nicotine dependence, unspecified, in remission: Secondary | ICD-10-CM

## 2024-02-24 DIAGNOSIS — Z122 Encounter for screening for malignant neoplasm of respiratory organs: Secondary | ICD-10-CM

## 2024-02-24 DIAGNOSIS — F17210 Nicotine dependence, cigarettes, uncomplicated: Secondary | ICD-10-CM

## 2024-02-24 DIAGNOSIS — Z87891 Personal history of nicotine dependence: Secondary | ICD-10-CM

## 2024-02-24 DIAGNOSIS — F17200 Nicotine dependence, unspecified, uncomplicated: Secondary | ICD-10-CM | POA: Insufficient documentation

## 2024-02-24 DIAGNOSIS — Z0000 Encounter for general adult medical examination without abnormal findings: Secondary | ICD-10-CM | POA: Insufficient documentation

## 2024-02-25 ENCOUNTER — Other Ambulatory Visit (INDEPENDENT_AMBULATORY_CARE_PROVIDER_SITE_OTHER): Payer: Self-pay

## 2024-03-02 NOTE — Addendum Note (Signed)
 Addended by: EDSEL BENTON MATSU on: 03/02/2024 02:27 PM     Modules accepted: Orders

## 2024-03-04 ENCOUNTER — Ambulatory Visit (INDEPENDENT_AMBULATORY_CARE_PROVIDER_SITE_OTHER): Payer: Self-pay | Admitting: Internal Medicine

## 2024-03-04 ENCOUNTER — Ambulatory Visit: Payer: Self-pay | Attending: Internal Medicine | Admitting: Internal Medicine

## 2024-03-04 ENCOUNTER — Encounter (INDEPENDENT_AMBULATORY_CARE_PROVIDER_SITE_OTHER): Payer: Self-pay | Admitting: Internal Medicine

## 2024-03-04 ENCOUNTER — Other Ambulatory Visit: Payer: Self-pay

## 2024-03-04 VITALS — BP 96/60 | HR 75 | Temp 97.6°F | Resp 18 | Ht 73.0 in | Wt 255.2 lb

## 2024-03-04 DIAGNOSIS — M069 Rheumatoid arthritis, unspecified: Secondary | ICD-10-CM

## 2024-03-04 DIAGNOSIS — I2584 Coronary atherosclerosis due to calcified coronary lesion: Secondary | ICD-10-CM | POA: Insufficient documentation

## 2024-03-04 DIAGNOSIS — F17200 Nicotine dependence, unspecified, uncomplicated: Secondary | ICD-10-CM | POA: Insufficient documentation

## 2024-03-04 DIAGNOSIS — Z7984 Long term (current) use of oral hypoglycemic drugs: Secondary | ICD-10-CM

## 2024-03-04 DIAGNOSIS — I10 Essential (primary) hypertension: Secondary | ICD-10-CM | POA: Insufficient documentation

## 2024-03-04 DIAGNOSIS — Z0000 Encounter for general adult medical examination without abnormal findings: Secondary | ICD-10-CM | POA: Insufficient documentation

## 2024-03-04 DIAGNOSIS — K7030 Alcoholic cirrhosis of liver without ascites: Secondary | ICD-10-CM

## 2024-03-04 DIAGNOSIS — Z87891 Personal history of nicotine dependence: Secondary | ICD-10-CM

## 2024-03-04 DIAGNOSIS — Z7985 Long-term (current) use of injectable non-insulin antidiabetic drugs: Secondary | ICD-10-CM

## 2024-03-04 DIAGNOSIS — I2510 Atherosclerotic heart disease of native coronary artery without angina pectoris: Secondary | ICD-10-CM | POA: Insufficient documentation

## 2024-03-04 DIAGNOSIS — E1159 Type 2 diabetes mellitus with other circulatory complications: Secondary | ICD-10-CM

## 2024-03-04 MED ORDER — LOSARTAN 50 MG-HYDROCHLOROTHIAZIDE 12.5 MG TABLET
1.0000 | ORAL_TABLET | Freq: Every day | ORAL | 3 refills | Status: AC
Start: 2024-03-04 — End: 2025-03-04

## 2024-03-04 NOTE — Patient Instructions (Signed)
 Medicare Preventive Services  Medicare coverage information Recommendation for YOU   Heart Disease and Diabetes   Lipid profile every 5 years or more often if at risk for cardiovascular disease  Lab Results   Component Value Date    CHOLESTEROL 123 06/30/2023    HDLCHOL 36 (L) 06/30/2023    LDLCHOL 39 06/30/2023    TRIG 239 (H) 06/30/2023       Diabetes Screening with Blood Glucose test or Glucose Tolerance Test Yearly for those at risk for diabetes, up to two tests per year for those with prediabetes  Last Glucose: 153     Diabetes Self-Management Training   Initial training ten hours per year, and follow-up training two hours per subsequent year. Optional for those with diabetes    Medical Nutrition Therapy   Three hours of one-on-one counseling in first year, two hours in subsequent years. Optional for those with diabetes, kidney disease   Intensive Behavioral Therapy for Obesity  Face-to-face counseling, first month every week, month 2-6 every other week, month 7-12 every month if continued progress is documented Optional for those with Body Mass Index 30 or higher  Your Body mass index is 33.67 kg/m.   Tobacco Cessation (Quitting) Counseling   Two attempts per year, max 4 sessions per attempt, up to 8 sessions per year Optional for those who use tobacco    Cancer Screening Last Completion Date   Colorectal screening   For anyone age 46 to 48 or any age if high risk:  Screening Colonoscopy every 10 yrs if low risk,  more frequent if higher risk  OR  Cologuard Stool DNA test once every 3 years OR  Fecal Occult Blood Testing yearly OR  Flexible  Sigmoidoscopy  every 5 yr OR  CT Colonography every 5 yrs  --11/20/2022  See below for due date if applicable.   Prostate Cancer Screening  Prostate Specific Antigen blood test based on joint decision making with your provider for ages 67-69  A joint decision between you and your primary care provider   Lung Cancer Screening  Annual low dose computed tomography (LDCT  scan) is recommended for those age 70-80 who smoked 20 pack-years and are current smokers or quit smoking within past 15 years, after counseling by your doctor or nurse clinician about the possible benefits or harms. --02/24/2024  See below for due date if applicable.   Vaccinations   Respiratory syncytial virus (RSV)  Age 71 years or older: Based on shared clinical decision-making with your provider.  Pneumococcal Vaccine  Recommended routinely age 77+ with one or two separate vaccines based on your risk. Recommended before age 63 if medical conditions with increased risk  Seasonal Influenza Vaccine  Once every flu season   Hepatitis B Vaccine  3 doses if risk (including anyone with diabetes or liver disease)  Shingles Vaccine  Two doses at age 12 or older  Diphtheria Tetanus Pertussis Vaccine  ONCE as adult, booster every 10 years     Immunization History   Administered Date(s) Administered   . Covid-19 Vaccine,Pfizer-BioNTech,Purple Top,78yrs+ 08/02/2019, 08/23/2019   . FLUZONE HD VACCINE (ADMIN) 03/15/2018, 06/04/2021   . High-Dose Influenza Vaccine, 65+ (FLUZONE HD) 03/15/2018, 06/04/2021, 02/18/2024   . Influenza Vaccine, 6 month-adult 02/24/2016, 02/28/2020   . Influenza Vaccine, 65+ (FLUAD) 02/24/2016, 02/15/2019, 02/28/2020, 02/22/2022, 03/11/2023   . PREVNAR 13 12/03/2018   . Pneumovax 04/17/2022   . Shingrix - Zoster Vaccine 12/03/2018, 02/18/2024   . Tetanus Toxoid/Diphtheria Toxoid/Acellular Pertussis Vaccine,  Adsorbed 12/03/2018   . Tetanus,Diptheria,Pertussis(BOOSTRIX) 12/03/2018     Shingles vaccine and Diphtheria Tetanus Pertussis vaccines are available at pharmacies or local health department without a prescription.   Other Preventative Screening  Last Completion Date   Glaucoma Screening   Yearly if in high risk group such as diabetes, family history, African American age 109+ or Hispanic American age 43+ See your Eye Care Provider   Hepatitis C Screening   Recommended  for those born between ages  18-79 years. --06/30/2023  See below for due date if applicable.     HIV Testing  Recommended routinely at least ONCE, covered every year for age 76 to 45 regardless of risk, and every year for age over 1 who ask for the test or higher risk. Yearly or up to 3 times in pregnancy    See below for due date if applicable.  Bone Densitometry   Screening is recommended for Men ages 90 and above with one or more risk factor: androgen deprivation therapy for prostate cancer, hypogonadism, frailty, primary hyperparathyroidism, hyperthyroidism  For men diagnosed with osteoporosis, follow up is recommended every years or a frequency recommended by your provider     See below for due date if applicable.   Abdominal Aortic Aneurysm Screening Ultrasound   Once with a family history of abdominal aortic aneurysms OR a male between65-75 and have smoked at least 100 cigarettes in your lifetime.   --09/24/2021  See below for due date if applicable.       Your Personalized Schedule for Preventive Tests   Health Maintenance: Pending and Last Completed       Date Due Completion Date    Hepatitis A Vaccine (1 of 2 - Risk 2-dose series) Never done ---    RSV Adult 60+ or Pregnancy (1 - Risk 60-74 years 1-dose series) Never done ---    NonMedicare Preventative Exam 10/20/2023 10/20/2022    Diabetic A1C 04/29/2024 10/29/2023    Override on 03/12/2022: Previously completed (6.5%)    Diabetic Kidney Health Microalb/Cr Ratio 06/29/2024 06/30/2023    Diabetic Retinal Exam 10/14/2024 10/15/2023    Override on 03/12/2022: Previously completed (No retinopathy)    Depression Screening 10/25/2024 10/26/2023    Diabetic Kidney Health eGFR 12/24/2024 12/25/2023    CT Lung Cancer Screening 02/23/2025 02/24/2024    Colonoscopy 11/20/2027 11/20/2022    Adult Tdap-Td (2 - Td or Tdap) 12/02/2028 12/03/2018              For Information on Advanced Directives for Health Care:  [place]:  [email or web address]  [place], [place], [place], [place] General Information:  [email or web address]

## 2024-03-04 NOTE — Assessment & Plan Note (Addendum)
 Moderate per cath with [facility] 05/25- medical tx  LDL 39 02/25  Meds:  ASA 81 mg  Coreg  Pravasatatin 20 mg LDL 39  02/25

## 2024-03-04 NOTE — Progress Notes (Addendum)
 FAMILY MEDICINE, MEDICAL OFFICE BUILDING  [address]  [place] [address]  Operated by [facility]    SEARS ORAN  Mar 16, 1951  [id number]    Date of Service: 03/04/2024  2:00 PM EDT    Chief complaint:   Chief Complaint   Patient presents with    Follow Up       Subjective:     This is a case of a 73 y.o. year old male who comes in today for CDM.    He expresses no problems. He has intentionally lost wt.  I started him on Lasix  40 mg last spring when his wt was up and his legs were swollen.    Wt Readings from Last 6 Encounters:   03/04/24 116 kg (255 lb 3.2 oz)   03/04/24 116 kg (255 lb 3.2 oz)   02/22/24 117 kg (259 lb)   10/26/23 117 kg (259 lb)   10/20/23 118 kg (260 lb 3.2 oz)   10/01/23 122 kg (268 lb)         Last A1C w/ [facility] 7.6% about 3 mos ago.  Has upcoming appt.     He had fluid drained from left knee and this helped his pain.    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 This is stable from March 2024. The location would be accessible to percutaneous biopsy biopsy if clinically needed, but no definite suspicious features.    He has had GI f/u    Recent lab results reviewed and noted.  AFP 6  COMPLETE BLOOD COUNT   Lab Results   Component Value Date    WBC 4.2 12/25/2023    HGB 13.7 12/25/2023    HCT 40.0 12/25/2023    PLTCNT 101 (L) 12/25/2023       COMPREHENSIVE METABOLIC PANEL FASTING  Lab Results   Component Value Date    SODIUM 137 12/25/2023    POTASSIUM 3.7 12/25/2023    CHLORIDE 103 12/25/2023    CO2 26 12/25/2023    ANIONGAP 8 12/25/2023    BUN 14 12/25/2023    CREATININE 0.84 12/25/2023    GLUCOSE >1000 (A) 10/01/2023    CALCIUM 9.9 12/25/2023    ALBUMIN 4.2 12/25/2023    TOTALPROTEIN 8.3 12/25/2023    ALKPHOS 70 12/25/2023    AST 66 (H) 12/25/2023    ALT 52 12/25/2023    BILIRUBINCON 0.15 07/15/2023       ROS:  Constitutional  - no appetite or weight changes, no fatigue, no fevers, chills, or night sweats  Respiratory - no dyspnea or cough  Cardiovascular - no chest pain or palpitations  Gastrointestinal - no nausea, vomiting, diarrhea, or constipation, no dyspepsia  Endocrine - no heat or cold intolerance, no polyuria, polydipsia, or polyphagia  Skin - no rashes, color changes, or lesions  Musculoskeletal - no arthralgias or myalgias  Genitourinary - no urinary frequency or dysuria, no genital discharge  Neurologic - no vision or hearing changes, no weakness, no parasthesias   Psychiatric - mood has been appropriate, no depression or anxiety    Patient Active Problem List    Diagnosis Date Noted    Health care maintenance 02/24/2024    Type 2 diabetes mellitus with circulatory disorder 10/26/2023    Coronary artery disease due to calcified coronary lesion 10/26/2023    Cirrhosis 02/19/2023  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) 04/18/2022    Gastritis 04/18/2022    Hiatal hernia 04/18/2022    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    History of colon polyps 04/17/2022    Dyslipidemia 08/05/2021    Tobacco use disorder 08/05/2021    Abnormal cardiovascular stress test 06/09/2007       Past Surgical History:   Procedure Laterality Date    HX TONSILLECTOMY  1959    SURGERY SCROTAL / TESTICULAR  2004    [facility]           Current Outpatient Medications   Medication Sig    aspirin 81 mg Oral Tablet, Chewable Chew 1 Tablet (81 mg total) One time    carvediloL (COREG) 3.125 mg Oral Tablet Take 1 Tablet (3.125 mg total) by mouth Twice daily    cetirizine  (ZYRTEC ) 10 mg Oral Tablet Take 1 Tablet (10 mg total) by mouth Daily    cyclobenzaprine  (FLEXERIL ) 10 mg Oral Tablet TAKE 1 TABLET (10 MG TOTAL) BY MOUTH THREE TIMES A DAY INDICATIONS: MUSCLE SPASM    ENBREL SURECLICK 50 mg/mL (1 mL) Subcutaneous Pen Injector Inject 1 mL (50 mg total) under  the skin Every 7 days    ergocalciferol, vitamin D2, (DRISDOL) 1,250 mcg (50,000 unit) Oral Capsule Take 1 Capsule (50,000 Units total) by mouth Every 7 days    famotidine (PEPCID) 40 mg Oral Tablet Take 1 Tablet (40 mg total) by mouth Every morning    fluticasone  propionate (FLONASE ) 50 mcg/actuation Nasal Spray, Suspension SPRAY 1 SPRAY INTO EACH NOSTRIL EVERY DAY    furosemide  (LASIX ) 40 mg Oral Tablet TAKE 1 TABLET (40 MG TOTAL) BY MOUTH ONCE PER DAY AS NEEDED    glipiZIDE (GLUCOTROL XL) 5 mg Oral Tablet Extended Rel 24 hr TAKE 1 (ONE) TABLET BEFORE DINNER (Patient taking differently: Take 1 Tablet (5 mg total) by mouth 2 before dinner)    JARDIANCE 25 mg Oral Tablet Take 1 Tablet (25 mg total) by mouth Daily    losartan -hydrochlorothiazide  (HYZAAR ) 50-12.5 mg Oral Tablet Take 1 Tablet by mouth Once a day    MetFORMIN (GLUCOPHAGE) 1,000 mg Oral Tablet Take 1 Tablet (1,000 mg total) by mouth Twice daily    montelukast  (SINGULAIR ) 10 mg Oral Tablet Take 1 Tablet (10 mg total) by mouth Daily    omeprazole (PRILOSEC) 40 mg Oral Capsule, Delayed Release(E.C.) TAKE 1 CAPSULE BY MOUTH EVERY DAY BEFORE DINNER    pravastatin (PRAVACHOL) 20 mg Oral Tablet Take 1 Tablet (20 mg total) by mouth Daily    tamsulosin  (FLOMAX ) 0.4 mg Oral Capsule TAKE 1 CAPSULE BY MOUTH EVERY DAY IN THE EVENING AFTER DINNER    tirzepatide  (MOUNJARO ) 10 mg/0.5 mL Subcutaneous Pen Injector Inject 0.5 mL (10 mg total) under the skin Every 7 days Indications: type 2 diabetes mellitus       Objective:     BP 96/60 (Site: Left Arm, Patient Position: Sitting, Cuff Size: Adult)   Pulse 75   Temp 36.4 C (97.6 F) (Tympanic)   Resp 18   Ht 1.854 m (6' 1)   Wt 116 kg (255 lb 3.2 oz)   SpO2 94%   BMI 33.67 kg/m       BMI addressed: Intervention for BMI inappropriate due to age over 61 and comorbidities.     General appearance: alert, cooperative, in no acute distress  HEENT: PERRL; no LAD or thyromegaly,  neck supple.  Lungs: clear to auscultation  bilaterally; no wheezes or rhonchi   Heart: regular rate and rhythm; normal S1 & S2; no murmur  Abdomen: soft, non-tender, not distended; bowel sounds present.  No palpable masses.  Extremities: extremities normal ROM, no cyanosis or edema , no rash  Psych: alert and oriented x 3  Neuro: CN 2-12 grossly intact  Peripheral motor and sensory exams are grossly normal    Assessment/Plan     Assessment & Plan  Type 2 diabetes mellitus with circulatory disorder  Glipizide 5mg  daily  Jardiance 25 mg daily  Metformin 1000 mg twice daily  Mounjaro  10 mg weekly  Advised pt that if his FBS goes below 150 he can decrease dose of Glipizide or if below 100 stop it altogether.  Advised that that is the one medication that puts him at risk of a low BS.     Obtain labs from [facility]          Coronary artery disease due to calcified coronary lesion  Moderate per cath with [facility] 05/25- medical tx  LDL 39 02/25  Meds:  ASA 81 mg  Coreg  Pravasatatin 20 mg LDL 39  02/25            Tobacco use disorder  Quit 2021  LDCT 10/25 neg       Hypertension, unspecified type  Stop furosemide  due to low BP- labs from 08/25 showed normal renal function  Cont. Losartan -HCTZ  Coreg  Orders:    losartan -hydrochlorothiazide  (HYZAAR ) 50-12.5 mg Oral Tablet; Take 1 Tablet by mouth Daily               Health Maintenance:   Ck PSA next visit w/ all labs    RSV vaccine rec'd            The patient was given the opportunity to ask questions and those questions were answered to the patient's satisfaction. The patient was encouraged to call with any additional questions or concerns. Instructed patient to call back if symptoms worse.   Discussed with patient effects and side effects of medications. Medication safety was discussed. A copy of the patient's medication list was printed and given to the patient. A good faith effort was made to reconcile the patient's medications.       Follow up: Return in about 4 months (around 07/05/2024).    Calhoun LITTIE Na, MD

## 2024-03-04 NOTE — Assessment & Plan Note (Addendum)
 Glipizide 5mg  daily  Jardiance 25 mg daily  Metformin 1000 mg twice daily  Mounjaro  10 mg weekly  Advised pt that if his FBS goes below 150 he can decrease dose of Glipizide or if below 100 stop it altogether.  Advised that that is the one medication that puts him at risk of a low BS.     Obtain labs from [facility]

## 2024-03-04 NOTE — Progress Notes (Signed)
 [facility] MEDICAL OFFICE [facility]  FAMILY MEDICINE, MEDICAL OFFICE BUILDING  [address]  [place] [address]  [phone]   Miguel Hendricks  12-25-1950  [id number]    Date of Service: 03/04/2024    Chief complaint: Non-medicare annual     Subjective:      Miguel Hendricks is a 73 y.o. male and is here for an annual wellness exam.  The patient reports No problems.    Past Medical History:  Past Medical History:   Diagnosis Date    Allergic rhinitis     Diabetes mellitus     DNS (deviated nasal septum)     Esophageal reflux     Essential hypertension     Hx of skin malignancy     Impacted cerumen of both ears          Past Surgical History:   Procedure Laterality Date    HX TONSILLECTOMY  1959    SURGERY SCROTAL / TESTICULAR  2004    [facility]          Family Medical History:    None           GU History:  Patient receives prostate care outside our clinic    Date last PSA: next visit    Last colonoscopy : 2019        Medications:  Current Outpatient Medications   Medication Sig Dispense Refill    aspirin 81 mg Oral Tablet, Chewable Chew 1 Tablet (81 mg total) One time      carvediloL (COREG) 3.125 mg Oral Tablet Take 1 Tablet (3.125 mg total) by mouth Twice daily      cetirizine  (ZYRTEC ) 10 mg Oral Tablet Take 1 Tablet (10 mg total) by mouth Daily 90 Tablet 3    cyclobenzaprine  (FLEXERIL ) 10 mg Oral Tablet TAKE 1 TABLET (10 MG TOTAL) BY MOUTH THREE TIMES A DAY INDICATIONS: MUSCLE SPASM 90 Tablet 2    ENBREL SURECLICK 50 mg/mL (1 mL) Subcutaneous Pen Injector Inject 1 mL (50 mg total) under the skin Every 7 days      ergocalciferol, vitamin D2, (DRISDOL) 1,250 mcg (50,000 unit) Oral Capsule Take 1 Capsule (50,000 Units total) by mouth Every 7 days      famotidine (PEPCID) 40 mg Oral Tablet Take 1 Tablet (40 mg total) by mouth Every morning      fluticasone  propionate (FLONASE ) 50 mcg/actuation Nasal Spray, Suspension SPRAY 1 SPRAY INTO EACH NOSTRIL EVERY DAY 16 g 3    glipiZIDE (GLUCOTROL XL) 5 mg Oral Tablet  Extended Rel 24 hr TAKE 1 (ONE) TABLET BEFORE DINNER (Patient taking differently: Take 1 Tablet (5 mg total) by mouth 2 before dinner)      JARDIANCE 25 mg Oral Tablet Take 1 Tablet (25 mg total) by mouth Daily      losartan -hydrochlorothiazide  (HYZAAR ) 50-12.5 mg Oral Tablet Take 1 Tablet by mouth Daily 90 Tablet 3    MetFORMIN (GLUCOPHAGE) 1,000 mg Oral Tablet Take 1 Tablet (1,000 mg total) by mouth Twice daily      montelukast  (SINGULAIR ) 10 mg Oral Tablet Take 1 Tablet (10 mg total) by mouth Daily 90 Tablet 3    omeprazole (PRILOSEC) 40 mg Oral Capsule, Delayed Release(E.C.) TAKE 1 CAPSULE BY MOUTH EVERY DAY BEFORE DINNER      pravastatin (PRAVACHOL) 20 mg Oral Tablet Take 1 Tablet (20 mg total) by mouth Daily      tamsulosin  (FLOMAX ) 0.4 mg Oral Capsule TAKE 1 CAPSULE BY MOUTH EVERY  DAY IN THE EVENING AFTER DINNER 90 Capsule 1    tirzepatide  (MOUNJARO ) 10 mg/0.5 mL Subcutaneous Pen Injector Inject 0.5 mL (10 mg total) under the skin Every 7 days Indications: type 2 diabetes mellitus 2 mL 0     No current facility-administered medications for this visit.     Do you take any herbs or supplements that were not prescribed by a doctor? Yes Mens MVI gummy  Are you taking calcium supplements? no  Are you taking aspirin daily? yes  Are you using nicotene products?  Vaping? No   What is your alcohol use? No   Illicit drug use? No     Seatbelts: Yes  Cell phone use while driving: No     Vaccinations: UTD    Allergies[1]    Social History     Socioeconomic History    Marital status: Married     Spouse name: Not on file    Number of children: Not on file    Years of education: Not on file    Highest education level: Not on file   Occupational History    Not on file   Tobacco Use    Smoking status: Former     Current packs/day: 0.00     Average packs/day: 1 pack/day for 50.0 years (50.0 ttl pk-yrs)     Types: Cigarettes     Start date: 57     Quit date: 2021     Years since quitting: 4.8    Smokeless tobacco: Never     Tobacco comments:     Counseled by Dr. Layman on 06/16/23   Vaping Use    Vaping status: Never Used   Substance and Sexual Activity    Alcohol use: Not Currently    Drug use: Not Currently    Sexual activity: Not on file   Other Topics Concern    Not on file   Social History Narrative    Not on file     Social Determinants of Health     Financial Resource Strain: Not on file   Transportation Needs: Not on file   Social Connections: Not on file   Intimate Partner Violence: Not on file   Housing Stability: Not on file       Review of Systems    ROS       Objective:     BP 96/60 (Site: Left Arm, Patient Position: Sitting, Cuff Size: Adult)   Pulse 75   Temp 36.4 C (97.6 F) (Tympanic)   Resp 18   Ht 1.854 m (6' 1)   Wt 116 kg (255 lb 3.2 oz)   SpO2 94%   BMI 33.67 kg/m         Physical Exam     HEENT: PERRL; no LAD or thyromegaly, neck supple.  Lungs: clear to auscultation bilaterally; no wheezes or rhonchi   Heart: regular rate and rhythm; normal S1 & S2; no murmur  Abdomen: soft, non-tender, not distended; bowel sounds present.  No palpable masses.  Extremities: extremities normal ROM, no cyanosis or edema , no rash  Psych: alert and oriented x 3  Neuro: CN 2-12 grossly intact  Peripheral motor and sensory exams are grossly normal  Laboratory:   See CDM  Assessment:   1)  ASCVD- stable.  CC 05/25 for medical management  2)  T2DM- obtain recent labs from [facility]  3)  Health care maintenance- needs PSA  4)  RA treated with Enbrel  Plan:     PLAN:  Counseling with regards to preventative care given  Patient given Advanced Directive if applicable? yes  Immunizations reviewed   Vaccines given: Rec'd RSV      Patient Counseling:  --Nutrition: advise patient to eat a diet that is primarily lean protein and produce (fruits/vegetables), drink primarily water and low/no calorie drinks, moderation on caffeine/sugary/greasy foods    --Dental health: Discussed importance of regular tooth brushing, flossing, and dental  visits.           Plan:  No orders of the defined types were placed in this encounter.                     Return in about 1 year (around 03/04/2025).    Calhoun LITTIE Na, MD 03/04/2024, 15:21         [1] No Known Allergies
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
## 2024-03-04 NOTE — Assessment & Plan Note (Addendum)
 Quit 2021  LDCT 10/25 neg

## 2024-03-07 ENCOUNTER — Other Ambulatory Visit (INDEPENDENT_AMBULATORY_CARE_PROVIDER_SITE_OTHER): Payer: Self-pay | Admitting: Internal Medicine

## 2024-03-07 MED ORDER — MOUNJARO 10 MG/0.5 ML SUBCUTANEOUS PEN INJECTOR: 10.0000 mg | PEN_INJECTOR | SUBCUTANEOUS | 0 refills | Status: DC

## 2024-03-20 ENCOUNTER — Encounter (HOSPITAL_COMMUNITY): Payer: Self-pay

## 2024-03-20 ENCOUNTER — Emergency Department (HOSPITAL_COMMUNITY)

## 2024-03-20 ENCOUNTER — Other Ambulatory Visit: Payer: Self-pay

## 2024-03-20 ENCOUNTER — Emergency Department
Admission: EM | Admit: 2024-03-20 | Discharge: 2024-03-20 | Disposition: A | Discharge status: Home / Self Care | Source: Skilled Nursing Facility | Attending: Physician Assistant | Admitting: Physician Assistant

## 2024-03-20 DIAGNOSIS — M19071 Primary osteoarthritis, right ankle and foot: Secondary | ICD-10-CM

## 2024-03-20 DIAGNOSIS — M069 Rheumatoid arthritis, unspecified: Secondary | ICD-10-CM | POA: Insufficient documentation

## 2024-03-20 DIAGNOSIS — Z79620 Long term (current) use of immunosuppressive biologic: Secondary | ICD-10-CM | POA: Insufficient documentation

## 2024-03-20 DIAGNOSIS — Z7984 Long term (current) use of oral hypoglycemic drugs: Secondary | ICD-10-CM | POA: Insufficient documentation

## 2024-03-20 DIAGNOSIS — D696 Thrombocytopenia, unspecified: Secondary | ICD-10-CM | POA: Insufficient documentation

## 2024-03-20 DIAGNOSIS — R609 Edema, unspecified: Secondary | ICD-10-CM

## 2024-03-20 DIAGNOSIS — M7989 Other specified soft tissue disorders: Secondary | ICD-10-CM | POA: Insufficient documentation

## 2024-03-20 DIAGNOSIS — I70201 Unspecified atherosclerosis of native arteries of extremities, right leg: Secondary | ICD-10-CM | POA: Insufficient documentation

## 2024-03-20 DIAGNOSIS — I10 Essential (primary) hypertension: Secondary | ICD-10-CM | POA: Insufficient documentation

## 2024-03-20 DIAGNOSIS — R600 Localized edema: Secondary | ICD-10-CM | POA: Insufficient documentation

## 2024-03-20 DIAGNOSIS — E1151 Type 2 diabetes mellitus with diabetic peripheral angiopathy without gangrene: Secondary | ICD-10-CM | POA: Insufficient documentation

## 2024-03-20 DIAGNOSIS — Z79899 Other long term (current) drug therapy: Secondary | ICD-10-CM | POA: Insufficient documentation

## 2024-03-20 DIAGNOSIS — M25471 Effusion, right ankle: Secondary | ICD-10-CM | POA: Insufficient documentation

## 2024-03-20 LAB — URIC ACID: URIC ACID: 5.9 mg/dL (ref 2.3–7.6)

## 2024-03-20 LAB — CBC WITH DIFF
BASOPHIL #: 0 x10ˆ3/uL (ref 0.00–0.10)
BASOPHIL %: 1 % (ref 0–1)
EOSINOPHIL #: 0.1 x10ˆ3/uL (ref 0.00–0.60)
EOSINOPHIL %: 2 % (ref 1–8)
HCT: 38.8 % (ref 36.7–47.1)
HGB: 13.5 g/dL (ref 12.5–16.3)
LYMPHOCYTE #: 1.5 x10ˆ3/uL (ref 1.00–3.00)
LYMPHOCYTE %: 34 % (ref 15–43)
MCH: 31.7 pg (ref 23.8–33.4)
MCHC: 34.9 g/dL (ref 32.5–36.3)
MCV: 90.8 fL (ref 73.0–96.2)
MONOCYTE #: 0.5 x10ˆ3/uL (ref 0.30–1.10)
MONOCYTE %: 11 % (ref 6–14)
MPV: 9.5 fL (ref 7.4–11.4)
NEUTROPHIL #: 2.3 x10ˆ3/uL (ref 1.85–7.84)
NEUTROPHIL %: 52 % (ref 44–74)
PLATELETS: 106 x10ˆ3/uL — ABNORMAL LOW (ref 140–440)
RBC: 4.27 x10ˆ6/uL (ref 4.06–5.63)
RDW: 14.2 % (ref 12.1–16.2)
WBC: 4.3 x10ˆ3/uL (ref 3.6–10.2)

## 2024-03-20 LAB — COMPREHENSIVE METABOLIC PANEL, NON-FASTING
ALBUMIN/GLOBULIN RATIO: 1 (ref 0.8–1.4)
ALBUMIN: 4 g/dL (ref 3.5–5.7)
ALKALINE PHOSPHATASE: 62 U/L (ref 34–104)
ALT (SGPT): 29 U/L (ref 7–52)
ANION GAP: 10 mmol/L (ref 4–13)
AST (SGOT): 38 U/L (ref 13–39)
BILIRUBIN TOTAL: 0.6 mg/dL (ref 0.3–1.0)
BUN/CREA RATIO: 18 (ref 6–22)
BUN: 14 mg/dL (ref 7–25)
CALCIUM, CORRECTED: 9.6 mg/dL (ref 8.9–10.8)
CALCIUM: 9.6 mg/dL (ref 8.6–10.3)
CHLORIDE: 105 mmol/L (ref 98–107)
CO2 TOTAL: 21 mmol/L (ref 21–31)
CREATININE: 0.78 mg/dL (ref 0.60–1.30)
ESTIMATED GFR: 95 mL/min/1.73mˆ2 (ref >59)
GLOBULIN: 4.1 — ABNORMAL HIGH (ref 2.0–3.5)
GLUCOSE: 178 mg/dL — ABNORMAL HIGH (ref 74–109)
OSMOLALITY, CALCULATED: 277 mosm/kg (ref 270–290)
POTASSIUM: 3.9 mmol/L (ref 3.5–5.1)
PROTEIN TOTAL: 8.1 g/dL (ref 6.4–8.9)
SODIUM: 136 mmol/L (ref 136–145)

## 2024-03-20 LAB — B-TYPE NATRIURETIC PEPTIDE (BNP),PLASMA: BNP: 41 pg/mL (ref 1–100)

## 2024-03-20 MED ORDER — DICLOFENAC 1 % TOPICAL GEL
Freq: Four times a day (QID) | CUTANEOUS | 0 refills | Status: AC
Start: 2024-03-20 — End: ?

## 2024-03-20 MED ORDER — PREDNISONE 10 MG TABLET: 10.0000 mg | ORAL_TABLET | Freq: Two times a day (BID) | ORAL | 0 refills | Status: DC

## 2024-03-20 NOTE — ED Nurses Note (Signed)
 Patient to ED room 30. He says he has had swelling and pain to his right foot for about a month. He was on Lasix  and swelling improved and his doctor took him off the Lasix  because his Blood pressure was dropping. He says he has no pain when lying down but foot is very painful if he puts any weight on it.

## 2024-03-20 NOTE — ED APP Handoff Note (Signed)
 [facility] - Emergency Department  Emergency Department  Provider in Triage Note    Name: Miguel Hendricks  Age: 73 y.o.  Gender: male     Subjective:   Miguel Hendricks is a 73 y.o. male who presents with complaint of Leg Swelling (Right /)  .  Patient presents to ED today for right lower leg swelling that has been present for proximally a week now.  Patient states it is worse with ambulation.  Patient states it does come up to his mid calf down to his left foot.  Was recently taken off his Lasix  due to hypotension.    Objective:   Filed Vitals:    03/20/24 1148   BP: 134/81   Pulse: 86   Resp: 16   Temp: 36.2 C (97.2 F)   SpO2: 94%      Vitals are also documented in the EMR.  Focused Physical Exam shows erythema and swelling noted to the right lower extremity.  CNS is intact.  Capillary refill is brisk.    Assessment:  A medical screening exam was completed.  This patient is a 73 y.o. male with Leg Swelling (Right /)  .    Plan:  Please see initial orders and work-up in the EMR.  This is to be continued with full evaluation in the main Emergency Department.     No current facility-administered medications for this encounter.     Results for orders placed or performed during the hospital encounter of 03/20/24 (from the past 24 hours)   CBC/DIFF    Collection Time: 03/20/24 11:50 AM    Narrative    The following orders were created for panel order CBC/DIFF.  Procedure                               Abnormality         Status                     ---------                               -----------         ------                     CBC WITH IPQQ[[id number]]                                                                 Please view results for these tests on the individual orders.          THEOPHILUS Elbe FNP-C   03/20/2024, 11:51   Department of Emergency Medicine  [facility]

## 2024-03-20 NOTE — Discharge Instructions (Addendum)
 VISIT SUMMARY:  Today, we addressed your right foot pain and swelling, which has been ongoing for about a month. We discussed your current medications and medical history, and we have a plan to manage your symptoms and investigate further.    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  -We have ordered an ultrasound to rule out a blood clot in your leg, which was negative  -You will start taking short course of steroids  -We have referred you to a podiatrist for possible joint injections.    TYPE 2 DIABETES MELLITUS: Your recent glucose level was 178 mg/dL.  -Continue managing your diabetes with your current oral medication.    ESSENTIAL HYPERTENSION: Your high blood pressure is being managed with losartan .  -Continue taking losartan  as prescribed.    FATTY LIVER DISEASE: You were diagnosed with fatty liver disease 15 years ago, and there has been no recent worsening.  -No changes to your current management plan are needed.    RHEUMATOID ARTHRITIS: You are managing your rheumatoid arthritis with weekly Enbrel injections.  -Continue your weekly Enbrel injections as prescribed.       Contains text generated by [employer]

## 2024-03-20 NOTE — ED Notes (Signed)
 This tech attached the cardiac monitor, BP cuff, and pulse ox to patient.

## 2024-03-20 NOTE — ED Triage Notes (Signed)
 Pt present to ED c/o swelling in right foot with pain radiating into calf x 2 weeks. He states pain is worse with ambulation and improves with rest and elevation.

## 2024-03-20 NOTE — ED Provider Notes (Signed)
 [facility] - Emergency Department  ED Primary Note  History of Present Illness  Miguel Hendricks is a 73 year old male with arthritis who presents with right foot pain and swelling.    He has been experiencing pain and swelling in his right foot for approximately one month. The swelling began around the same time as the pain, which he describes as similar to a sprain, although he denies any injury. The pain is primarily present when weight-bearing and affects the entire foot. Initially, the pain subsided after being on his feet for a while, but it has progressively worsened, with the most severe pain occurring this morning.    He has been taking furosemide  to manage the edema. Initially, the swelling decreased with the medication, but it returned upon cessation. He resumed furosemide  about a week ago, and the swelling has reduced somewhat. He took the medication as recently as this morning.    No history of gout, recent surgeries, or long-distance travel. He mentions mild swelling in the calf without pain and no swelling in the left leg or foot.    His past medical history includes fatty liver diagnosed approximately 15 years ago with no significant progression. He also has hypertension, for which he takes losartan , and diabetes, managed with oral medication. He has arthritis and receives Enbrel injections weekly for rheumatoid arthritis. He has a history of fluid accumulation in his knees, which has been drained at [facility] office. No history of blood clots.  Physical Exam   ED Triage Vitals [03/20/24 1148]   BP (Non-Invasive) 134/81   Heart Rate 86   Respiratory Rate 16   Temperature 36.2 C (97.2 F)   SpO2 94 %   Weight 115 kg (254 lb)   Height 1.854 m (6' 1)     Physical Exam  GENERAL: Alert, cooperative, well developed, no acute distress.  HEENT: Normocephalic, normal oropharynx, moist mucous membranes.  CHEST: Clear to auscultation bilaterally, no wheezes, rhonchi, or  crackles.  CARDIOVASCULAR: Normal heart rate and rhythm, S1 and S2 normal without murmurs.  ABDOMEN: Soft, non-tender, non-distended, without organomegaly, normal bowel sounds.  EXTREMITIES: Mild pitting edema of bilateral lower legs, trace pitting edema of right foot, mild warmth of right foot compared to left, excellent pulses in both feet, no cyanosis, no appreciable erythema, no wounds, no specific area tender to palpation of foot.  NEUROLOGICAL: Cranial nerves grossly intact, moves all extremities without gross motor or sensory deficit.  Patient Data   Results  LABS  PLT: 106 (03/20/2024)  Glucose: 178 (03/20/2024)  Uric Acid: 5.9 (03/20/2024)  BNP: 41 (03/20/2024)    RADIOLOGY  Right Foot X-ray: Soft tissue swelling, small tibiotalar joint, peripheral vascular calcifications (03/20/2024)  Right Ankle X-ray: Mild soft tissue swelling, peripheral vascular calcifications (03/20/2024)    Labs Ordered/Reviewed   COMPREHENSIVE METABOLIC PANEL, NON-FASTING - Abnormal; Notable for the following components:       Result Value    GLUCOSE 178 (*)     GLOBULIN 4.1 (*)     All other components within normal limits    Narrative:     Estimated Glomerular Filtration Rate (eGFR) is calculated using the CKD-EPI (2021) equation, intended for patients 65 years of age and older. If gender is not documented or unknown, there will be no eGFR calculation.     CBC WITH DIFF - Abnormal; Notable for the following components:    PLATELETS 106 (*)     All other components within normal limits  URIC ACID - Normal   B-TYPE NATRIURETIC PEPTIDE (BNP),PLASMA - Normal    Narrative:                                 Class 1: 101-250 pg/mL                              Class 2: 251-550 pg/mL                              Class 3: 551-900 pg/mL                              Class 4: >901 pg/mL     The [place]  [employer] has developed a four-stage functional classification system for CHF that is based on a subjective interpretation of the  severity of a patient's clinical signs and symptoms.    Class 1 - Patients have no limitations on physical activity and have no symptoms with ordinary physical activity.    Class 2 - Patients have a slight limitation of physical activity and have symptoms with ordinary physical activity.    Class 3 - Patients have a marked limitation of physical activity and have symptoms with less than ordinary physical activity, but not at rest.    Class 4 - Patients are unable to perform any physical activity without discomfort.   CBC/DIFF    Narrative:     The following orders were created for panel order CBC/DIFF.  Procedure                               Abnormality         Status                     ---------                               -----------         ------                     CBC WITH IPQQ[[id number]]                Abnormal            Final result                 Please view results for these tests on the individual orders.     XR FOOT RIGHT   Final Result by Edi, Radresults In (11/09 1223)   Soft tissue swelling and small joint effusion. Peripheral vascular calcifications.                      Radiologist location ID: [id number]         XR ANKLE RIGHT   Final Result by Edi, Radresults In (11/09 1221)   Mild soft tissue swelling. Peripheral vascular calcifications.                         Radiologist location ID: [id number]           Medical Decision Making  Medical Decision Making  A 73 year old male with a history of type 2 diabetes, hypertension, fatty liver disease, and rheumatoid arthritis presented with a month of worsening right foot pain and edema, exacerbated by weight-bearing and without preceding trauma. Exam revealed mild pitting edema of the bilateral lower legs, trace edema and mild warmth of the right foot, but no erythema, focal tenderness, or wounds. Pulses were excellent bilaterally, and there was no significant swelling or pain in the left leg. Laboratory studies showed stable  thrombocytopenia, normal uric acid, and imaging demonstrated soft tissue swelling and vascular calcifications without acute findings.    Differential diagnosis includes, but is not limited to:  - Degenerative arthropathy (osteoarthritis): Diffuse right foot pain and swelling with mild warmth, absence of focal erythema or tenderness, and imaging findings are most consistent with degenerative arthropathy.  - Gout: Considered less likely due to lack of joint-specific redness, absence of focal tenderness, and normal uric acid level.  - Deep vein thrombosis (DVT): Considered due to unilateral swelling and pain, but less likely given only trace calf edema, no history of clots, and excellent pulses; ultrasound was ordered to rule out DVT.  - Cellulitis: Deemed unlikely due to absence of erythema, warmth not pronounced, and no evidence of skin infection on exam.  - Vascular insufficiency: Unlikely given excellent distal pulses and no evidence of compromised blood flow on exam.    Degenerative arthropathy of right foot with edema  - Prescribed low dose oral steroids/topical NSAIDs  - Referred to podiatry for possible intraarticular injections  - Labs and US  showing no acute abnormalities, x-ray shows soft tissue swelling and small joint effusion at the ankle, peripheral vascular calcifications.    Medical Decision Making  Amount and/or Complexity of Data Reviewed  Radiology:  Decision-making details documented in ED Course.      ED Course as of 03/20/24 1405   Sun Mar 20, 2024   1250 XR FOOT RIGHT  Soft tissue swelling and small joint effusion. Peripheral vascular calcifications.     1250 XR ANKLE RIGHT  Mild soft tissue swelling. Peripheral vascular calcifications   1327 PERIPHERAL VENOUS DUPLEX - LOWER  Negative study. No deep vein thrombosis.               Clinical Impression   Swelling   Degenerative joint disease of right ankle and foot (Primary)       Disposition: Discharged           This note was created with  assistance from [employer]bridge via capture of conversational audio.  Consent was obtained from the patient prior to recording.

## 2024-03-20 NOTE — ED Nurses Note (Signed)
 Patient D/C home at this time. Instructions reviewed and understanding verbalized. Discharge paperwork provided. Patient left department via ambulation.

## 2024-03-28 ENCOUNTER — Ambulatory Visit: Attending: Orthopaedic Surgery | Admitting: Orthopaedic Surgery

## 2024-03-28 DIAGNOSIS — M25462 Effusion, left knee: Secondary | ICD-10-CM | POA: Insufficient documentation

## 2024-03-28 LAB — SYNOVIAL FLUID DIFFERENTIAL
NEUTROPHIL %: 76 %
OTHER CELL %: 24 %

## 2024-03-28 LAB — SYNOVIAL FLUID
BODY FLUID VOLUME: 25 mL
NUCLEATED CELLS, FLUID: 9264 /uL

## 2024-03-29 ENCOUNTER — Other Ambulatory Visit: Payer: Self-pay

## 2024-03-29 ENCOUNTER — Ambulatory Visit (INDEPENDENT_AMBULATORY_CARE_PROVIDER_SITE_OTHER): Payer: Self-pay | Admitting: Internal Medicine

## 2024-03-29 LAB — SYNOVIAL (JOINT) FLUID CRYSTALS

## 2024-04-02 LAB — FLUID CULTURE & GRAM STAIN
GRAM STAIN: NONE SEEN
STERILE SITE CULTURE, AEROBIC: NO GROWTH

## 2024-04-13 ENCOUNTER — Other Ambulatory Visit: Payer: Self-pay

## 2024-04-13 ENCOUNTER — Ambulatory Visit (INDEPENDENT_AMBULATORY_CARE_PROVIDER_SITE_OTHER): Admitting: Specialist

## 2024-04-13 ENCOUNTER — Encounter (INDEPENDENT_AMBULATORY_CARE_PROVIDER_SITE_OTHER): Payer: Self-pay | Admitting: Specialist

## 2024-04-13 VITALS — BP 118/66 | HR 82 | Temp 97.7°F | Wt 252.4 lb

## 2024-04-13 DIAGNOSIS — I2510 Atherosclerotic heart disease of native coronary artery without angina pectoris: Secondary | ICD-10-CM

## 2024-04-13 NOTE — Progress Notes (Addendum)
 CARDIOLOGY, MEDICAL ARTS BUILDING  [address] [place] [address]       Cardiology Progress Note    Name: SAMAN UMSTEAD MRN:  [id number]   Date: 04/13/2024 Age: 73 y.o.     Chief Compliant:  Chief Complaint              Follow Up 6 Months             HPI:  This 73 year old white male returns in follow-up.  Has a history of nonobstructive coronary artery disease.  He underwent cardiac catheterization by Dr. Annabelle several years ago.  There was noted to be 30% narrowing of the LAD and several branches of the PDA.  He does have history of diabetes and hyperlipidemia.  He has been on long-term statin therapy which she tolerates well.  He is a diabetic who has recently lost several lb.  He denies any chest pain shortness of breath or palpitations.  On exam he appears in no acute distress the blood pressure 118/66 there is no JVD no carotid bruit.  Cardiac-wise he is in a regular rhythm.  He is doing well with his coronary artery disease is asymptomatic in that regard.      Past Medical History  Past Medical History:   Diagnosis Date    Allergic rhinitis     Diabetes mellitus     DNS (deviated nasal septum)     Esophageal reflux     Essential hypertension     Hx of skin malignancy     Impacted cerumen of both ears      Past Surgical History:   Procedure Laterality Date    HX TONSILLECTOMY  1959    SURGERY SCROTAL / TESTICULAR  2004    [facility]      Family Medical History:    None        Tobacco Use History[1]   Current Outpatient Medications   Medication Sig    aspirin 81 mg Oral Tablet, Chewable Chew 1 Tablet (81 mg total) One time    carvediloL (COREG) 3.125 mg Oral Tablet Take 1 Tablet (3.125 mg total) by mouth Twice daily    cetirizine  (ZYRTEC ) 10 mg Oral Tablet Take 1 Tablet (10 mg total) by mouth Daily    cyclobenzaprine  (FLEXERIL ) 10 mg Oral Tablet TAKE 1 TABLET (10 MG TOTAL) BY MOUTH THREE TIMES A DAY INDICATIONS: MUSCLE SPASM    diclofenac  sodium (VOLTAREN) 1 % Gel Apply topically Four times a day     ENBREL SURECLICK 50 mg/mL (1 mL) Subcutaneous Pen Injector Inject 1 mL (50 mg total) under the skin Every 7 days    ergocalciferol, vitamin D2, (DRISDOL) 1,250 mcg (50,000 unit) Oral Capsule Take 1 Capsule (50,000 Units total) by mouth Every 7 days    famotidine (PEPCID) 40 mg Oral Tablet Take 1 Tablet (40 mg total) by mouth Every morning    fluticasone  propionate (FLONASE ) 50 mcg/actuation Nasal Spray, Suspension SPRAY 1 SPRAY INTO EACH NOSTRIL EVERY DAY    glipiZIDE (GLUCOTROL XL) 5 mg Oral Tablet Extended Rel 24 hr TAKE 1 (ONE) TABLET BEFORE DINNER (Patient taking differently: Take 1 Tablet (5 mg total) by mouth 2 before dinner)    JARDIANCE 25 mg Oral Tablet Take 1 Tablet (25 mg total) by mouth Daily    losartan -hydrochlorothiazide  (HYZAAR ) 50-12.5 mg Oral Tablet Take 1 Tablet by mouth Daily    MetFORMIN (GLUCOPHAGE) 1,000 mg Oral Tablet Take 1 Tablet (1,000 mg total) by mouth  Twice daily    montelukast  (SINGULAIR ) 10 mg Oral Tablet Take 1 Tablet (10 mg total) by mouth Daily    omeprazole (PRILOSEC) 40 mg Oral Capsule, Delayed Release(E.C.) TAKE 1 CAPSULE BY MOUTH EVERY DAY BEFORE DINNER    pravastatin (PRAVACHOL) 20 mg Oral Tablet Take 1 Tablet (20 mg total) by mouth Daily    predniSONE  (DELTASONE ) 10 mg Oral Tablet Take 1 Tablet (10 mg total) by mouth Twice daily for 5 days    tamsulosin  (FLOMAX ) 0.4 mg Oral Capsule TAKE 1 CAPSULE BY MOUTH EVERY DAY IN THE EVENING AFTER DINNER    tirzepatide  (MOUNJARO ) 10 mg/0.5 mL Subcutaneous Pen Injector Inject 0.5 mL (10 mg total) under the skin Every 7 days Indications: type 2 diabetes mellitus     Allergies[2]    Review of Systems:  A focused review of systems was completed as described in the HPI.    BP 118/66 (Site: Left Arm, Patient Position: Sitting, Cuff Size: Adult)   Pulse 82   Temp 36.5 C (97.7 F) (Temporal)   Wt 114 kg (252 lb 6.4 oz)   SpO2 95%   BMI 33.30 kg/m       See cardiac examination above    Assessment and Plan:He will return in 6  months      See additional plan details above    Vaughn JULIANNA Requena, MD         [1]   Social History  Tobacco Use   Smoking Status Former    Current packs/day: 0.00    Average packs/day: 1 pack/day for 50.0 years (50.0 ttl pk-yrs)    Types: Cigarettes    Start date: 51    Quit date: 2021    Years since quitting: 4.9   Smokeless Tobacco Never   Tobacco Comments    Counseled by Dr. Layman on 06/16/23   [2] No Known Allergies

## 2024-04-14 ENCOUNTER — Encounter (INDEPENDENT_AMBULATORY_CARE_PROVIDER_SITE_OTHER): Payer: Self-pay | Admitting: Specialist

## 2024-04-15 ENCOUNTER — Encounter (INDEPENDENT_AMBULATORY_CARE_PROVIDER_SITE_OTHER): Admitting: Specialist

## 2024-04-18 ENCOUNTER — Other Ambulatory Visit: Payer: Self-pay

## 2024-04-18 ENCOUNTER — Ambulatory Visit: Attending: Internal Medicine | Admitting: Internal Medicine

## 2024-04-18 VITALS — BP 122/64 | HR 98 | Temp 97.4°F | Resp 18 | Ht 73.0 in | Wt 250.8 lb

## 2024-04-18 DIAGNOSIS — I8500 Esophageal varices without bleeding: Secondary | ICD-10-CM | POA: Insufficient documentation

## 2024-04-18 DIAGNOSIS — Z7984 Long term (current) use of oral hypoglycemic drugs: Secondary | ICD-10-CM

## 2024-04-18 DIAGNOSIS — M19071 Primary osteoarthritis, right ankle and foot: Secondary | ICD-10-CM

## 2024-04-18 DIAGNOSIS — I2584 Coronary atherosclerosis due to calcified coronary lesion: Secondary | ICD-10-CM

## 2024-04-18 DIAGNOSIS — M05769 Rheumatoid arthritis with rheumatoid factor of unspecified knee without organ or systems involvement: Secondary | ICD-10-CM | POA: Insufficient documentation

## 2024-04-18 DIAGNOSIS — E119 Type 2 diabetes mellitus without complications: Secondary | ICD-10-CM | POA: Insufficient documentation

## 2024-04-18 DIAGNOSIS — Z7985 Long-term (current) use of injectable non-insulin antidiabetic drugs: Secondary | ICD-10-CM

## 2024-04-18 DIAGNOSIS — I2510 Atherosclerotic heart disease of native coronary artery without angina pectoris: Secondary | ICD-10-CM | POA: Insufficient documentation

## 2024-04-18 DIAGNOSIS — K7030 Alcoholic cirrhosis of liver without ascites: Secondary | ICD-10-CM | POA: Insufficient documentation

## 2024-04-18 MED ORDER — PREDNISONE 10 MG TABLET: ORAL_TABLET | ORAL | 0 refills | Status: DC

## 2024-04-18 MED ORDER — METHYLPREDNISOLONE ACETATE 80 MG/ML SUSPENSION FOR INJECTION
80.0000 mg | INTRAMUSCULAR | Status: AC
  Administered 2024-04-18: 80 mg via INTRAMUSCULAR

## 2024-04-18 NOTE — Progress Notes (Unsigned)
 FAMILY MEDICINE, MEDICAL OFFICE BUILDING  [address]  [place] [address]  Operated by [facility]    Miguel Hendricks  [birth year]  [id number]    Date of Service: 04/18/2024  3:00 PM EST    Chief complaint: No chief complaint on file.      Subjective:     This is a case of a 73 y.o. year old male who comes in today for 3 days of right ankle pain.     Right ankle was tapped by Dr. Cecilie.  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     He has had 3 episodes of right ankle pain in the last 2 months.  He was seen in the ER in October and x-rays showed DJD.  He has seen Cecilie and had a steroid injection 03/28/24.  It is not clear to me that this was aspirated as all of the results say left knee.      1 yr ago :  left knee    Calcium pyrophosphate like crystals present Abnormal        Recent lab results reviewed and noted.  BASIC METABOLIC PANEL  Lab Results   Component Value Date    SODIUM 136 03/20/2024    POTASSIUM 3.9 03/20/2024    CHLORIDE 105 03/20/2024    CO2 21 03/20/2024    ANIONGAP 10 03/20/2024    BUN 14 03/20/2024    CREATININE 0.78 03/20/2024    BUNCRRATIO 18 03/20/2024    GFR 95 03/20/2024    CALCIUM 9.6 03/20/2024    GLUCOSENF 178 (H) 03/20/2024      URIC ACID  Lab Results   Component Value Date    URICACID 5.9 03/20/2024       ROS:  Constitutional - no appetite or weight changes, no fatigue, no fevers, chills, or night sweats  Respiratory - no dyspnea or cough  Cardiovascular - no chest pain or palpitations  Gastrointestinal - no nausea, vomiting, diarrhea, or constipation, no dyspepsia  Endocrine - no heat or cold intolerance, no polyuria, polydipsia, or polyphagia  Skin - no rashes, color changes, or lesions  Musculoskeletal - no arthralgias or myalgias  Genitourinary - no urinary frequency or dysuria, no genital discharge  Neurologic  - no vision or hearing changes, no weakness, no parasthesias   Psychiatric - mood has been appropriate, no depression or anxiety    Patient Active Problem List    Diagnosis Date Noted    Health care maintenance 02/24/2024    Type 2 diabetes mellitus with circulatory disorder 10/26/2023    Coronary artery disease due to calcified coronary lesion 10/26/2023    Cirrhosis 02/19/2023    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) 04/18/2022    Gastritis 04/18/2022    Hiatal hernia 04/18/2022    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    History of colon polyps 04/17/2022    Dyslipidemia 08/05/2021    Tobacco use disorder 08/05/2021    Abnormal cardiovascular stress test 06/09/2007       Past Surgical History:   Procedure Laterality Date    HX TONSILLECTOMY  1959    SURGERY SCROTAL / TESTICULAR  2004    [facility]           Current Outpatient  Medications   Medication Sig    aspirin 81 mg Oral Tablet, Chewable Chew 1 Tablet (81 mg total) One time    carvediloL (COREG) 3.125 mg Oral Tablet Take 1 Tablet (3.125 mg total) by mouth Twice daily    cetirizine  (ZYRTEC ) 10 mg Oral Tablet Take 1 Tablet (10 mg total) by mouth Daily    cyclobenzaprine  (FLEXERIL ) 10 mg Oral Tablet TAKE 1 TABLET (10 MG TOTAL) BY MOUTH THREE TIMES A DAY INDICATIONS: MUSCLE SPASM    diclofenac  sodium (VOLTAREN) 1 % Gel Apply topically Four times a day    ENBREL SURECLICK 50 mg/mL (1 mL) Subcutaneous Pen Injector Inject 1 mL (50 mg total) under the skin Every 7 days    ergocalciferol, vitamin D2, (DRISDOL) 1,250 mcg (50,000 unit) Oral Capsule Take 1 Capsule (50,000 Units total) by mouth Every 7 days    famotidine (PEPCID) 40 mg Oral Tablet Take 1 Tablet (40 mg total) by mouth Every morning    fluticasone  propionate (FLONASE ) 50 mcg/actuation Nasal Spray, Suspension SPRAY 1 SPRAY INTO EACH NOSTRIL EVERY DAY    glipiZIDE (GLUCOTROL XL) 5 mg Oral Tablet Extended  Rel 24 hr TAKE 1 (ONE) TABLET BEFORE DINNER (Patient taking differently: Take 1 Tablet (5 mg total) by mouth 2 before dinner)    JARDIANCE 25 mg Oral Tablet Take 1 Tablet (25 mg total) by mouth Daily    losartan -hydrochlorothiazide  (HYZAAR ) 50-12.5 mg Oral Tablet Take 1 Tablet by mouth Daily    MetFORMIN (GLUCOPHAGE) 1,000 mg Oral Tablet Take 1 Tablet (1,000 mg total) by mouth Twice daily    montelukast  (SINGULAIR ) 10 mg Oral Tablet Take 1 Tablet (10 mg total) by mouth Daily    omeprazole (PRILOSEC) 40 mg Oral Capsule, Delayed Release(E.C.) TAKE 1 CAPSULE BY MOUTH EVERY DAY BEFORE DINNER    pravastatin (PRAVACHOL) 20 mg Oral Tablet Take 1 Tablet (20 mg total) by mouth Daily    tamsulosin  (FLOMAX ) 0.4 mg Oral Capsule TAKE 1 CAPSULE BY MOUTH EVERY DAY IN THE EVENING AFTER DINNER    tirzepatide  (MOUNJARO ) 10 mg/0.5 mL Subcutaneous Pen Injector Inject 0.5 mL (10 mg total) under the skin Every 7 days Indications: type 2 diabetes mellitus       Objective:     There were no vitals taken for this visit.      BMI addressed: Intervention for BMI inappropriate due to age over 80 and comorbidities.     General appearance: alert, cooperative, in no acute distress    Extremities: extremities normal ROM, no cyanosis or edema , no rash.  There is no visible swelling or redness of the right ankle or foot.  He locates the most tenderness over the lateral malleolus.  There is limited ROM of the ankle.   Vascular: Foot is warm and of normal color.  Psych: alert and oriented x 3  Neuro: CN 2-12 grossly intact  Peripheral motor and sensory exams are grossly normal    Assessment/Plan     Assessment & Plan  Arthritis of right ankle  Note written to [facility] w/ all resultswho he sees in the next 1-2 weeks.  He cannot take NSAIDs due to cirrhosis and varices and pt so advised.  Colchicine is another possibility if this is calcium pyrophosphate disease.  His RA usually affects both shoulders, hips, knees.  Advised him to discuss with [facility]  for better long term strategy since steroids will have adverse effects on his T2DM.   Orders:    predniSONE  (DELTASONE ) 10 mg Oral  Tablet; One pill twice a day for 5 days then one pill daily    methylPREDNISolone  acetate (DEPO-medrol ) 80 mg/mL injection    Type 2 diabetes mellitus         Alcoholic cirrhosis of liver without ascites (CMS HCC)         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)                    Health Maintenance:                   The patient was given the opportunity to ask questions and those questions were answered to the patient's satisfaction. The patient was encouraged to call with any additional questions or concerns. Instructed patient to call back if symptoms worse.   Discussed with patient effects and side effects of medications. Medication safety was discussed. A copy of the patient's medication list was printed and given to the patient. A good faith effort was made to reconcile the patient's medications.       Follow up: 02/26    Miguel Hendricks Na, MD

## 2024-04-18 NOTE — Assessment & Plan Note (Addendum)
 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 This is stable from March 2024. The location would be accessible to percutaneous biopsy biopsy if clinically needed, but no definite suspicious features.    Plts 106  AFP 6  Ot/Pt minimally elevated 03/25
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
## 2024-04-18 NOTE — Assessment & Plan Note (Addendum)
 Advised pt not to use NSAIDs  and try Tylenol arthritis  Note to [facility]  Propranolol

## 2024-04-19 NOTE — Assessment & Plan Note (Signed)
Enbrel

## 2024-04-19 NOTE — Assessment & Plan Note (Signed)
 Moderate per cath with [facility] 05/25- medical tx  Another reason to avoid NSAIDs   LDL 39 02/25  Meds:  ASA 81 mg  Coreg  Pravastatin 20 mg LDL 39  02/25

## 2024-04-27 ENCOUNTER — Telehealth (INDEPENDENT_AMBULATORY_CARE_PROVIDER_SITE_OTHER): Payer: Self-pay | Admitting: Internal Medicine

## 2024-04-27 ENCOUNTER — Other Ambulatory Visit (INDEPENDENT_AMBULATORY_CARE_PROVIDER_SITE_OTHER): Payer: Self-pay | Admitting: Internal Medicine

## 2024-04-27 DIAGNOSIS — M19071 Primary osteoarthritis, right ankle and foot: Secondary | ICD-10-CM

## 2024-04-27 MED ORDER — PREDNISONE 10 MG TABLET: ORAL_TABLET | ORAL | 0 refills | Status: AC

## 2024-04-27 NOTE — Telephone Encounter (Signed)
Spoke with pt. He voiced understanding

## 2024-04-27 NOTE — Telephone Encounter (Signed)
 Spoke with pt. He voiced understanding, he see Dr. Nicholas on Monday, he stated that the prednisone  helped, it did better with taking two a daily, then one daily.

## 2024-04-27 NOTE — Telephone Encounter (Signed)
 Received call from pt. Stating that he having continue problems with his foot, worse today and it has ever been, pain and swelling intense, there anything you recommend?

## 2024-05-02 ENCOUNTER — Other Ambulatory Visit (INDEPENDENT_AMBULATORY_CARE_PROVIDER_SITE_OTHER): Payer: Self-pay | Admitting: Internal Medicine

## 2024-05-02 DIAGNOSIS — M545 Low back pain, unspecified: Secondary | ICD-10-CM

## 2024-05-03 ENCOUNTER — Emergency Department (HOSPITAL_COMMUNITY)

## 2024-05-03 ENCOUNTER — Encounter (HOSPITAL_COMMUNITY): Payer: Self-pay

## 2024-05-03 ENCOUNTER — Telehealth (INDEPENDENT_AMBULATORY_CARE_PROVIDER_SITE_OTHER): Payer: Self-pay | Admitting: Internal Medicine

## 2024-05-03 ENCOUNTER — Emergency Department: Admission: EM | Admit: 2024-05-03 | Discharge: 2024-05-03 | Disposition: A | Discharge status: Home / Self Care

## 2024-05-03 ENCOUNTER — Encounter (INDEPENDENT_AMBULATORY_CARE_PROVIDER_SITE_OTHER): Payer: Self-pay | Admitting: Internal Medicine

## 2024-05-03 ENCOUNTER — Other Ambulatory Visit: Payer: Self-pay

## 2024-05-03 DIAGNOSIS — M25571 Pain in right ankle and joints of right foot: Secondary | ICD-10-CM | POA: Insufficient documentation

## 2024-05-03 DIAGNOSIS — M25471 Effusion, right ankle: Secondary | ICD-10-CM | POA: Insufficient documentation

## 2024-05-03 HISTORY — DX: Arthropathy, unspecified: M12.9

## 2024-05-03 LAB — CBC WITH DIFF
BASOPHIL #: 0.1 x10ˆ3/uL (ref 0.00–0.10)
BASOPHIL %: 1 % (ref 0–1)
EOSINOPHIL #: 0 x10ˆ3/uL (ref 0.00–0.60)
EOSINOPHIL %: 0 % — ABNORMAL LOW (ref 1–8)
HCT: 39.6 % (ref 36.7–47.1)
HGB: 14 g/dL (ref 12.5–16.3)
LYMPHOCYTE #: 1.6 x10ˆ3/uL (ref 1.00–3.00)
LYMPHOCYTE %: 23 % (ref 15–43)
MCH: 32 pg (ref 23.8–33.4)
MCHC: 35.4 g/dL (ref 32.5–36.3)
MCV: 90.2 fL (ref 73.0–96.2)
MONOCYTE #: 0.5 x10ˆ3/uL (ref 0.30–1.10)
MONOCYTE %: 6 % (ref 6–14)
MPV: 8.7 fL (ref 7.4–11.4)
NEUTROPHIL #: 4.9 x10ˆ3/uL (ref 1.85–7.84)
NEUTROPHIL %: 70 % (ref 44–74)
PLATELETS: 186 x10ˆ3/uL (ref 140–440)
RBC: 4.39 x10ˆ6/uL (ref 4.06–5.63)
RDW: 14.4 % (ref 12.1–16.2)
WBC: 7 x10ˆ3/uL (ref 3.6–10.2)

## 2024-05-03 LAB — COMPREHENSIVE METABOLIC PANEL, NON-FASTING
ALBUMIN/GLOBULIN RATIO: 0.9 (ref 0.8–1.4)
ALBUMIN: 4.1 g/dL (ref 3.5–5.7)
ALKALINE PHOSPHATASE: 70 U/L (ref 34–104)
ALT (SGPT): 41 U/L (ref 7–52)
ANION GAP: 8 mmol/L (ref 4–13)
AST (SGOT): 41 U/L — ABNORMAL HIGH (ref 13–39)
BILIRUBIN TOTAL: 0.7 mg/dL (ref 0.3–1.0)
BUN/CREA RATIO: 18 (ref 6–22)
BUN: 15 mg/dL (ref 7–25)
CALCIUM, CORRECTED: 9.9 mg/dL (ref 8.9–10.8)
CALCIUM: 10 mg/dL (ref 8.6–10.3)
CHLORIDE: 103 mmol/L (ref 98–107)
CO2 TOTAL: 26 mmol/L (ref 21–31)
CREATININE: 0.85 mg/dL (ref 0.60–1.30)
ESTIMATED GFR: 92 mL/min/1.73mˆ2 (ref >59)
GLOBULIN: 4.5 — ABNORMAL HIGH (ref 2.0–3.5)
GLUCOSE: 200 mg/dL — ABNORMAL HIGH (ref 74–109)
OSMOLALITY, CALCULATED: 280 mosm/kg (ref 270–290)
POTASSIUM: 4.7 mmol/L (ref 3.5–5.1)
PROTEIN TOTAL: 8.6 g/dL (ref 6.4–8.9)
SODIUM: 137 mmol/L (ref 136–145)

## 2024-05-03 LAB — URIC ACID: URIC ACID: 6.2 mg/dL (ref 2.3–7.6)

## 2024-05-03 LAB — B-TYPE NATRIURETIC PEPTIDE (BNP),PLASMA: BNP: 27 pg/mL (ref 1–100)

## 2024-05-03 LAB — C-REACTIVE PROTEIN (CRP): C-REACTIVE PROTEIN (CRP): 1.6 mg/dL — ABNORMAL HIGH (ref 0.1–0.5)

## 2024-05-03 MED ORDER — FUROSEMIDE 20 MG TABLET: 20.0000 mg | ORAL_TABLET | Freq: Every day | ORAL | 0 refills | Status: DC | PRN

## 2024-05-03 MED ORDER — FUROSEMIDE 20 MG TABLET: 20.0000 mg | ORAL_TABLET | Freq: Two times a day (BID) | ORAL | 0 refills | Status: AC

## 2024-05-03 NOTE — Telephone Encounter (Signed)
-----   Message from Miguel LITTIE Na, MD sent at 05/03/2024  2:45 PM EST -----  Regarding: Letter to Dr. Nicholas  Dear Dr. Nicholas:    I am writing to you concerning our mutual patient, Miguel Hendricks.    [name] has had repeated bouts of right ankle pain that has responded to short course of steroids.  He had an arthrocentesis and intraarticular steroid injection in November with Dr. Cecilie.    I sent records to your office with those pertinent results and my hand written enclosed note to make you and your staff aware of this ongoing problem and for help with future management of it.  I received the same note in response- advice scrippled in the upper right hand corner and unsigned.    None of my concerns about his arthritis were addressed.  Additionally he has not complained to me of  leg swelling nor has it been present on my exams.  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     If you could please make sure that your staff addresses his rheumatologic problems I would be most grateful and the patient would benefit from his visits.     Sincerely,    Miguel LITTIE Na MD

## 2024-05-03 NOTE — ED Provider Notes (Signed)
 [facility] - Emergency Department  ED Primary Note  History of Present Illness  Miguel Hendricks is a 73 year old male who presents with swollen and painful ankle. He was referred by Dr. Layman for evaluation of right ankle swelling and pain.    Ankle swelling and pain  - Swelling and pain in the ankle for six weeks  - Significant impact on ability to walk  - Initial suspicion of arthritis, but rheumatologist determined it was not arthritis  - Rheumatologist suggested regular edema as the cause    Edema management  - Prescribed Lasix  for thirty days  - Would not renew prescription due to travel plans to [place]  for three months  - Self-administered wife's furosemide , resulting in reduced swelling and improved mobility  - Able to walk without a walker after furosemide  use  - Took two prednisone  tablets this morning    Cardiology follow-up  - No regular cardiologist  - Has not seen a cardiologist recently    Supplement use  - Takes a multivitamin regularly  Physical Exam   ED Triage Vitals [05/03/24 1653]   BP (Non-Invasive) 119/83   Heart Rate 100   Respiratory Rate 18   Temperature 36.6 C (97.8 F)   SpO2 96 %   Weight 113 kg (250 lb)   Height 1.854 m (6' 1)     Physical Exam  GENERAL: Alert, cooperative, well developed, no acute distress.  HEENT: Normocephalic, normal oropharynx, moist mucous membranes.  CHEST: Clear to auscultation bilaterally, no wheezes, rhonchi, or crackles.  CARDIOVASCULAR: Normal heart rate and rhythm, S1 and S2 normal without murmurs.  ABDOMEN: Soft, non-tender, non-distended, without organomegaly, normal bowel sounds.  EXTREMITIES: No clubbing or  cyanosis. 2+ pitting edema to right ankle.   NEUROLOGICAL: Cranial nerves grossly intact, moves all extremities without gross motor or sensory deficit.  Patient Data   Results  LABS  Uric acid: Elevated (05/03/2024)  Liver function tests: Within normal limits (05/03/2024)    DIAGNOSTIC  Pericardial effusion assessment: Not  elevated (05/03/2024)    Labs Ordered/Reviewed   COMPREHENSIVE METABOLIC PANEL, NON-FASTING - Abnormal; Notable for the following components:       Result Value    GLUCOSE 200 (*)     AST (SGOT) 41 (*)     GLOBULIN 4.5 (*)     All other components within normal limits    Narrative:     Estimated Glomerular Filtration Rate (eGFR) is calculated using the CKD-EPI (2021) equation, intended for patients 8 years of age and older. If gender is not documented or unknown, there will be no eGFR calculation.     CBC WITH DIFF - Abnormal; Notable for the following components:    EOSINOPHIL % 0 (*)     All other components within normal limits   C-REACTIVE PROTEIN (CRP) - Abnormal; Notable for the following components:    C-REACTIVE PROTEIN (CRP) 1.6 (*)     All other components within normal limits   B-TYPE NATRIURETIC PEPTIDE (BNP),PLASMA - Normal    Narrative:                                 Class 1: 101-250 pg/mL                              Class 2: 251-550 pg/mL  Class 3: 551-900 pg/mL                              Class 4: >901 pg/mL     The [place]  [employer] has developed a four-stage functional classification system for CHF that is based on a subjective interpretation of the severity of a patient's clinical signs and symptoms.    Class 1 - Patients have no limitations on physical activity and have no symptoms with ordinary physical activity.    Class 2 - Patients have a slight limitation of physical activity and have symptoms with ordinary physical activity.    Class 3 - Patients have a marked limitation of physical activity and have symptoms with less than ordinary physical activity, but not at rest.    Class 4 - Patients are unable to perform any physical activity without discomfort.   URIC ACID - Normal   CBC/DIFF    Narrative:     The following orders were created for panel order CBC/DIFF.  Procedure                               Abnormality         Status                      ---------                               -----------         ------                     CBC WITH IPQQ[[id number]]                Abnormal            Final result                 Please view results for these tests on the individual orders.     No orders to display     Medical Decision Making          Medical Decision Making  A 73 year old male presented with several weeks of right ankle swelling and pain, which improved significantly after self-administering furosemide  and prednisone . He has a history of prior evaluation by both his primary care physician and a rheumatologist, with arthritis deemed unlikely and regular edema suggested. On ED evaluation, laboratory studies excluded deep vein thrombosis, gout, pericardial effusion, and liver dysfunction; physical exam revealed improvement in swelling and mobility after diuretic use.    Differential diagnosis includes, but is not limited to:  - Deep vein thrombosis (DVT): DVT was considered as a cause of unilateral lower extremity swelling but was excluded based on laboratory and/or imaging findings.  - Gout: Gout was considered due to the joint swelling and pain, but uric acid levels were only mildly elevated and not diagnostic.  - Pericardial effusion: Pericardial effusion was considered as a potential cause of peripheral edema but was excluded based on laboratory findings.  - Edema due to fluid retention (possibly cardiac or hepatic in origin): Edema responsive to diuretics suggests fluid retention, with cardiac and hepatic causes considered; liver function was normal and further cardiac evaluation was recommended.  - Arthritis: Arthritis was considered but previously ruled out by rheumatology evaluation.    Right  ankle edema and pain (fluid retention)  Edema and pain improved with diuretic therapy; secondary causes including DVT, gout, pericardial effusion, and liver dysfunction were excluded; further cardiac evaluation is recommended.  - Prescribed furosemide  for  three additional days with instructions to use as needed and monitor for electrolyte depletion.  - Advised to maintain adequate hydration and take a multivitamin.  - Advised to elevate feet to reduce swelling.  - Recommended follow-up with cardiology for further evaluation of potential cardiac causes of edema.  Medical Decision Making  Amount and/or Complexity of Data Reviewed  Labs: ordered.  Radiology:  Decision-making details documented in ED Course.  ECG/medicine tests: independent interpretation performed.    Risk  Prescription drug management.      ED Course as of 05/03/24 2059   Tue May 03, 2024   2054 CBC/DIFF(!)  Unremarkable   2055 COMPREHENSIVE METABOLIC PANEL, NON-FASTING(!)  Unremarkable   2055 URIC ACID: 6.2   2055 B-TYPE NATRIURETIC PEPTIDE: 27   2055 C-REACTIVE PROTEIN (CRP)(!): 1.6   2055 PERIPHERAL VENOUS DUPLEX - LOWER  No evidence for DVT in the right lower extremity/extremities from the level of the common femoral to popliteal veins.          Clinical Impression   Right ankle swelling   Pain and swelling of right ankle (Primary)       Disposition: Discharged         This note was created with assistance from [employer] via capture of conversational audio.  Consent was obtained from the patient prior to recording.

## 2024-05-03 NOTE — Telephone Encounter (Signed)
Spoke with pt. He voiced understanding

## 2024-05-03 NOTE — Telephone Encounter (Signed)
 Letter faxed.

## 2024-05-03 NOTE — Telephone Encounter (Signed)
 Received call from Dr. Layman at 4:06 pm stated to inform pt. To go the ER, intense swelling in legs for an ultrasound to rule out blood clot .    Spoke with pt. He voiced understanding, to is going to ER this evening

## 2024-05-03 NOTE — Telephone Encounter (Signed)
 Spoke with pt. He stated that he went to Dr. Era office he stated that he does not have arthritis, there is something else causing his issues. pt.'s also said that he has intense swelling, he said mostly on the right  and that he can't find his ankles. No pain until he get up to walk on it. He can't get his shoes on his foot. If you would please sent in lasix  to [facility], per pt. Request. He also stated that his wife had a stroke Thursday, she is showing great signs of recovering, FYI

## 2024-05-03 NOTE — ED APP Handoff Note (Signed)
 [facility] - Emergency Department    Miguel Hendricks is a 73 y.o. male presenting to the ED today for:  Leg Swelling.  Right ankle pain worse with ambulation over the last 3 weeks.  Patient reports increasing swelling.  He denies redness.  He reports that his PCP wanted him checked for a blood clot as well as heart failure.  Patient denies chest pain, shortness of breath, fever, nausea, vomiting or other complaints.    Additional History:   Past Medical History:   Diagnosis Date    Allergic rhinitis     Arthropathy     Diabetes mellitus     DNS (deviated nasal septum)     Esophageal reflux     Essential hypertension     Hx of skin malignancy     Impacted cerumen of both ears      Past Surgical History:   Procedure Laterality Date    HX TONSILLECTOMY  1959    SURGERY SCROTAL / TESTICULAR  2004    mass     Pertinent Exam findings:  Filed Vitals:    05/03/24 1653   BP: 119/83   Pulse: 100   Resp: 18   Temp: 36.6 C (97.8 F)   SpO2: 96%       Gen: Nontoxic in appearance.  Head: NC/AT  Neck: Trachea midline. No gross meningismus.  Psych: Denis SI/HI.    Assessment: Medical Screening Exam.    Plan/MDM at Triage:  I have considered labs, imaging, EKG.  These have been ordered as clinically indicated.  I have considered that the patient may need medications.  However, due to treatment space limitation, medications considered have to be appropriate for the waiting room.    I have considered the patient's chronic conditions and have counseled the patient that we will be evaluating for the presence of an emergency medical condition today in which their chronic conditions may or may not play a role.  I have advised that the patient should notify their PCP of today's visit regardless of disposition.  I have considered the patient's social determinants of health and health care literacy while determining the patient's initial plan of care.  I have further advised that the patient will possibly see a 2nd  emergency provider today during their stay to help promote throughput.    I have performed an initial medical screening evaluation in order to determine if the patient has an emergency medical condition. The patient has been evaluated.  Imminent life, limb, or sight threatening emergency will be taken to the treatment area immediately. The patient's orders will be reviewed by nursing staff and placed in the treatment area as soon as possible as bed capacity allows.  Should the patient's status change, or a new sign/symptom develop, immediate notification of nursing or ED staff has been advised.

## 2024-05-03 NOTE — Telephone Encounter (Signed)
 Spoke pt. He voiced understand lasix   20 mg prn for 30 days.

## 2024-05-03 NOTE — Telephone Encounter (Signed)
-----   Message from Calhoun LITTIE Na, MD sent at 05/03/2024  2:34 PM EST -----  Regarding: Furosemide   As discussed this is a diuretic for swelling of the legs.  He has not complained to me of swelling of his legs.    If one ankle is swollen and painful this will not help.  I am very disappointed in the response of the rheumatology visit since my note clearly indicated he had had 3 episodes of ankle pain requiring steroids.  That is a totally different problem than swelling in his legs.  IF this is a new problem he needs to schedule an office visits.      If he has leg swelling then he needs to decrease sodium in his diet and elevated his legs.

## 2024-05-03 NOTE — Discharge Instructions (Signed)
 Thank you for allowing [place] to be part of your care.    Please discuss all medications with your pharmacist to ensure there are no concerns of interactions.    Please ensure all questions or concerns are addressed prior to leaving the hospital. We want to make sure your concerns are addressed to make sure you are as safe and healthy as possible. By leaving the hospital, it is understood you are in agreement with your treatment plan.    Please call the hospital medical records office for a copy of your finalized results, and review them with a primary care physician, for any findings needing further attention.    If you feel your situation worsens, or does not get better in 48 hours, please see a physician for evaluation.    We encourage you to see your regular doctor as soon as possible to let them know you were seen in the emergency department. They may want to do further testing. If you do not have a doctor, please feel free to call the hospital, and ask for contact information of accepting providers. Please also discuss your vaccinations, and ensure all are up to date.

## 2024-05-03 NOTE — ED Triage Notes (Signed)
 Right ankle pain in the ankle joint area with edema no redness x3 weeks.

## 2024-05-03 NOTE — ED Nurses Note (Signed)
 A full assessment was not completed by this nurse. The patient was seen and discharged from the waiting area. Patient discharged home with family.  AVS reviewed with patient/care giver.  A written copy of the AVS and discharge instructions was given to the patient/care giver.  Questions sufficiently answered as needed.  Patient/care giver encouraged to follow up with PCP as indicated.  In the event of an emergency, patient/care giver instructed to call 911 or go to the nearest emergency room.

## 2024-05-19 ENCOUNTER — Other Ambulatory Visit (INDEPENDENT_AMBULATORY_CARE_PROVIDER_SITE_OTHER): Payer: Self-pay | Admitting: Internal Medicine

## 2024-05-19 DIAGNOSIS — I10 Essential (primary) hypertension: Secondary | ICD-10-CM

## 2024-05-19 MED ORDER — TAMSULOSIN 0.4 MG CAPSULE: 0.4000 mg | ORAL_CAPSULE | Freq: Every evening | ORAL | 1 refills | Status: AC

## 2024-05-23 ENCOUNTER — Encounter (INDEPENDENT_AMBULATORY_CARE_PROVIDER_SITE_OTHER): Payer: Self-pay | Admitting: Specialist

## 2024-05-23 NOTE — Nursing Note (Signed)
 Dr. Nada has been on vacation and just returned today to the office. Surgery clearance was filled out and faxed to [facility].

## 2024-05-25 ENCOUNTER — Other Ambulatory Visit (INDEPENDENT_AMBULATORY_CARE_PROVIDER_SITE_OTHER): Payer: Self-pay | Admitting: Internal Medicine

## 2024-05-25 MED ORDER — MOUNJARO 12.5 MG/0.5 ML SUBCUTANEOUS PEN INJECTOR: 12.5000 mg | PEN_INJECTOR | SUBCUTANEOUS | 0 refills | Status: AC

## 2024-05-25 MED ORDER — FUROSEMIDE 20 MG TABLET: 20.0000 mg | ORAL_TABLET | Freq: Every day | ORAL | 0 refills | Status: AC | PRN

## 2024-05-27 ENCOUNTER — Other Ambulatory Visit (INDEPENDENT_AMBULATORY_CARE_PROVIDER_SITE_OTHER): Payer: Self-pay | Admitting: Internal Medicine

## 2024-05-27 NOTE — Telephone Encounter (Signed)
 Received call from pt. Asking for an increase on his mounjaro  from 10 mg to 12.5 mg, he has not had any problems on the 10 mg. He request that the mounjaro  be sent to [place]  since his wife is ill and he will be living there for about three months. Okay to send mounjaro  12.5 mg for pt.?

## 2024-05-27 NOTE — Telephone Encounter (Signed)
 Wrong pharmacy

## 2024-07-05 ENCOUNTER — Ambulatory Visit (INDEPENDENT_AMBULATORY_CARE_PROVIDER_SITE_OTHER): Payer: Self-pay | Admitting: Internal Medicine

## 2024-08-22 ENCOUNTER — Ambulatory Visit (INDEPENDENT_AMBULATORY_CARE_PROVIDER_SITE_OTHER): Payer: Self-pay | Admitting: OTOLARYNGOLOGY

## 2024-08-26 ENCOUNTER — Ambulatory Visit (INDEPENDENT_AMBULATORY_CARE_PROVIDER_SITE_OTHER): Admitting: Internal Medicine

## 2024-10-10 ENCOUNTER — Encounter (INDEPENDENT_AMBULATORY_CARE_PROVIDER_SITE_OTHER): Payer: Self-pay | Admitting: Specialist

## 2025-03-07 ENCOUNTER — Ambulatory Visit (HOSPITAL_COMMUNITY): Payer: Self-pay

## 2025-03-10 ENCOUNTER — Ambulatory Visit (INDEPENDENT_AMBULATORY_CARE_PROVIDER_SITE_OTHER): Payer: Self-pay | Admitting: Internal Medicine
